# Patient Record
Sex: Female | Born: 1985 | Race: White | Hispanic: No | Smoking: Current every day smoker
Health system: Southern US, Community
[De-identification: ages and names within clinical notes are randomized; demographics above are authoritative.]

## PROBLEM LIST (undated history)

## (undated) DIAGNOSIS — F909 Attention-deficit hyperactivity disorder, unspecified type: Secondary | ICD-10-CM

## (undated) DIAGNOSIS — F32A Depression, unspecified: Secondary | ICD-10-CM

## (undated) DIAGNOSIS — N83209 Unspecified ovarian cyst, unspecified side: Secondary | ICD-10-CM

## (undated) DIAGNOSIS — F329 Major depressive disorder, single episode, unspecified: Secondary | ICD-10-CM

## (undated) DIAGNOSIS — F419 Anxiety disorder, unspecified: Secondary | ICD-10-CM

## (undated) HISTORY — PX: CYSTECTOMY: SUR359

## (undated) HISTORY — DX: Anxiety disorder, unspecified: F41.9

## (undated) HISTORY — PX: WISDOM TOOTH EXTRACTION: SHX21

## (undated) HISTORY — DX: Unspecified ovarian cyst, unspecified side: N83.209

## (undated) HISTORY — DX: Attention-deficit hyperactivity disorder, unspecified type: F90.9

## (undated) HISTORY — DX: Major depressive disorder, single episode, unspecified: F32.9

## (undated) HISTORY — DX: Depression, unspecified: F32.A

---
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
## 2004-08-11 ENCOUNTER — Inpatient Hospital Stay (HOSPITAL_COMMUNITY): Admission: AD | Admit: 2004-08-11 | Discharge: 2004-08-11 | Payer: Self-pay | Admitting: *Deleted

## 2004-08-13 ENCOUNTER — Inpatient Hospital Stay (HOSPITAL_COMMUNITY): Admission: AD | Admit: 2004-08-13 | Discharge: 2004-08-13 | Payer: Self-pay | Admitting: Obstetrics & Gynecology

## 2004-08-16 ENCOUNTER — Inpatient Hospital Stay (HOSPITAL_COMMUNITY): Admission: AD | Admit: 2004-08-16 | Discharge: 2004-08-16 | Payer: Self-pay | Admitting: Family Medicine

## 2004-08-17 ENCOUNTER — Ambulatory Visit: Payer: Self-pay | Admitting: Obstetrics and Gynecology

## 2004-08-21 ENCOUNTER — Inpatient Hospital Stay (HOSPITAL_COMMUNITY): Admission: AD | Admit: 2004-08-21 | Discharge: 2004-08-21 | Payer: Self-pay | Admitting: Obstetrics & Gynecology

## 2004-08-21 ENCOUNTER — Ambulatory Visit: Payer: Self-pay | Admitting: Obstetrics & Gynecology

## 2004-10-23 ENCOUNTER — Inpatient Hospital Stay (HOSPITAL_COMMUNITY): Admission: AD | Admit: 2004-10-23 | Discharge: 2004-10-23 | Payer: Self-pay | Admitting: Obstetrics & Gynecology

## 2004-11-03 ENCOUNTER — Ambulatory Visit (HOSPITAL_COMMUNITY): Admission: RE | Admit: 2004-11-03 | Discharge: 2004-11-03 | Payer: Self-pay | Admitting: Obstetrics & Gynecology

## 2004-11-09 ENCOUNTER — Inpatient Hospital Stay (HOSPITAL_COMMUNITY): Admission: AD | Admit: 2004-11-09 | Discharge: 2004-11-10 | Payer: Self-pay | Admitting: Obstetrics and Gynecology

## 2004-11-27 ENCOUNTER — Ambulatory Visit (HOSPITAL_COMMUNITY): Admission: RE | Admit: 2004-11-27 | Discharge: 2004-11-27 | Payer: Self-pay | Admitting: Obstetrics and Gynecology

## 2005-05-06 ENCOUNTER — Inpatient Hospital Stay (HOSPITAL_COMMUNITY): Admission: AD | Admit: 2005-05-06 | Discharge: 2005-05-06 | Payer: Self-pay | Admitting: *Deleted

## 2005-06-22 ENCOUNTER — Inpatient Hospital Stay (HOSPITAL_COMMUNITY): Admission: AD | Admit: 2005-06-22 | Discharge: 2005-06-22 | Payer: Self-pay | Admitting: Obstetrics and Gynecology

## 2005-06-24 ENCOUNTER — Inpatient Hospital Stay (HOSPITAL_COMMUNITY): Admission: AD | Admit: 2005-06-24 | Discharge: 2005-06-27 | Payer: Self-pay | Admitting: Obstetrics and Gynecology

## 2005-08-10 ENCOUNTER — Other Ambulatory Visit: Admission: RE | Admit: 2005-08-10 | Discharge: 2005-08-10 | Payer: Self-pay | Admitting: Obstetrics and Gynecology

## 2006-07-08 ENCOUNTER — Encounter (INDEPENDENT_AMBULATORY_CARE_PROVIDER_SITE_OTHER): Payer: Self-pay | Admitting: Specialist

## 2006-07-08 ENCOUNTER — Ambulatory Visit (HOSPITAL_COMMUNITY): Admission: RE | Admit: 2006-07-08 | Discharge: 2006-07-08 | Payer: Self-pay | Admitting: Obstetrics and Gynecology

## 2006-09-17 ENCOUNTER — Emergency Department (HOSPITAL_COMMUNITY): Admission: EM | Admit: 2006-09-17 | Discharge: 2006-09-17 | Payer: Self-pay | Admitting: Family Medicine

## 2006-11-12 ENCOUNTER — Emergency Department (HOSPITAL_COMMUNITY): Admission: EM | Admit: 2006-11-12 | Discharge: 2006-11-12 | Payer: Self-pay | Admitting: Emergency Medicine

## 2007-01-23 ENCOUNTER — Emergency Department (HOSPITAL_COMMUNITY): Admission: EM | Admit: 2007-01-23 | Discharge: 2007-01-23 | Payer: Self-pay | Admitting: Emergency Medicine

## 2007-01-26 ENCOUNTER — Inpatient Hospital Stay (HOSPITAL_COMMUNITY): Admission: AD | Admit: 2007-01-26 | Discharge: 2007-01-26 | Payer: Self-pay | Admitting: Obstetrics and Gynecology

## 2007-01-26 LAB — CONVERTED CEMR LAB
Bilirubin Urine: NEGATIVE
Clue Cells Wet Prep HPF POC: NONE SEEN
Hemoglobin, Urine: NEGATIVE
Hemoglobin: 13.1 g/dL
Ketones, ur: NEGATIVE mg/dL
MCV: 88.1 fL
Nitrite: NEGATIVE
Platelets: 294 10*3/uL
RBC: 4.25 M/uL
Urobilinogen, UA: 0.2
WBC, Wet Prep HPF POC: NONE SEEN
WBC: 5.8 10*3/uL
Yeast Wet Prep HPF POC: NONE SEEN

## 2007-01-29 ENCOUNTER — Ambulatory Visit: Payer: Self-pay | Admitting: Nurse Practitioner

## 2007-01-29 DIAGNOSIS — J45909 Unspecified asthma, uncomplicated: Secondary | ICD-10-CM | POA: Insufficient documentation

## 2007-01-29 DIAGNOSIS — F17200 Nicotine dependence, unspecified, uncomplicated: Secondary | ICD-10-CM | POA: Insufficient documentation

## 2007-01-29 DIAGNOSIS — N83209 Unspecified ovarian cyst, unspecified side: Secondary | ICD-10-CM

## 2007-04-15 ENCOUNTER — Ambulatory Visit: Payer: Self-pay | Admitting: Nurse Practitioner

## 2007-04-15 DIAGNOSIS — K589 Irritable bowel syndrome without diarrhea: Secondary | ICD-10-CM

## 2007-04-15 DIAGNOSIS — F411 Generalized anxiety disorder: Secondary | ICD-10-CM | POA: Insufficient documentation

## 2007-04-15 LAB — CONVERTED CEMR LAB
AST: 13 units/L (ref 0–37)
Albumin: 4.6 g/dL (ref 3.5–5.2)
Alkaline Phosphatase: 42 units/L (ref 39–117)
BUN: 12 mg/dL (ref 6–23)
Calcium: 9.4 mg/dL (ref 8.4–10.5)
Chloride: 105 meq/L (ref 96–112)
Creatinine, Ser: 0.71 mg/dL (ref 0.40–1.20)
Glucose, Bld: 82 mg/dL (ref 70–99)
MCV: 88.1 fL (ref 78.0–100.0)
Monocytes Relative: 6 % (ref 3–12)
Platelets: 243 10*3/uL (ref 150–400)
Potassium: 4.3 meq/L (ref 3.5–5.3)
RDW: 12.5 % (ref 11.5–15.5)
Total Protein: 7.6 g/dL (ref 6.0–8.3)

## 2007-04-16 ENCOUNTER — Encounter (INDEPENDENT_AMBULATORY_CARE_PROVIDER_SITE_OTHER): Payer: Self-pay | Admitting: Nurse Practitioner

## 2007-05-08 ENCOUNTER — Encounter (INDEPENDENT_AMBULATORY_CARE_PROVIDER_SITE_OTHER): Payer: Self-pay | Admitting: Nurse Practitioner

## 2007-09-27 ENCOUNTER — Inpatient Hospital Stay (HOSPITAL_COMMUNITY): Admission: AD | Admit: 2007-09-27 | Discharge: 2007-09-27 | Payer: Self-pay | Admitting: Obstetrics and Gynecology

## 2007-12-28 ENCOUNTER — Inpatient Hospital Stay (HOSPITAL_COMMUNITY): Admission: AD | Admit: 2007-12-28 | Discharge: 2007-12-28 | Payer: Self-pay | Admitting: Obstetrics and Gynecology

## 2008-01-05 ENCOUNTER — Inpatient Hospital Stay (HOSPITAL_COMMUNITY): Admission: AD | Admit: 2008-01-05 | Discharge: 2008-01-05 | Payer: Self-pay | Admitting: Obstetrics and Gynecology

## 2008-01-28 ENCOUNTER — Observation Stay (HOSPITAL_COMMUNITY): Admission: RE | Admit: 2008-01-28 | Discharge: 2008-01-28 | Payer: Self-pay | Admitting: Obstetrics and Gynecology

## 2008-01-31 ENCOUNTER — Inpatient Hospital Stay (HOSPITAL_COMMUNITY): Admission: AD | Admit: 2008-01-31 | Discharge: 2008-02-03 | Payer: Self-pay | Admitting: Obstetrics and Gynecology

## 2008-02-09 ENCOUNTER — Encounter: Admission: RE | Admit: 2008-02-09 | Discharge: 2008-02-09 | Payer: Self-pay | Admitting: Obstetrics and Gynecology

## 2010-03-01 ENCOUNTER — Ambulatory Visit (HOSPITAL_COMMUNITY)
Admission: RE | Admit: 2010-03-01 | Discharge: 2010-03-01 | Payer: Self-pay | Source: Home / Self Care | Attending: Surgery | Admitting: Surgery

## 2010-06-17 ENCOUNTER — Inpatient Hospital Stay (INDEPENDENT_AMBULATORY_CARE_PROVIDER_SITE_OTHER)
Admission: RE | Admit: 2010-06-17 | Discharge: 2010-06-17 | Disposition: A | Discharge status: Home / Self Care | Payer: Federal, State, Local not specified - PPO | Source: Ambulatory Visit | Attending: Emergency Medicine | Admitting: Emergency Medicine

## 2010-06-17 DIAGNOSIS — M545 Low back pain: Secondary | ICD-10-CM

## 2010-06-17 LAB — POCT URINALYSIS DIP (DEVICE)
Glucose, UA: NEGATIVE mg/dL
Hgb urine dipstick: NEGATIVE
Nitrite: NEGATIVE
Specific Gravity, Urine: 1.01 (ref 1.005–1.030)

## 2010-06-17 LAB — POCT PREGNANCY, URINE: Preg Test, Ur: NEGATIVE

## 2010-07-28 ENCOUNTER — Other Ambulatory Visit: Payer: Self-pay | Admitting: Physician Assistant

## 2010-07-28 ENCOUNTER — Encounter (INDEPENDENT_AMBULATORY_CARE_PROVIDER_SITE_OTHER): Payer: Self-pay | Admitting: Physician Assistant

## 2010-07-28 DIAGNOSIS — N912 Amenorrhea, unspecified: Secondary | ICD-10-CM

## 2010-07-28 LAB — POCT PREGNANCY, URINE: Preg Test, Ur: NEGATIVE

## 2010-07-29 NOTE — Group Therapy Note (Signed)
NAMESAVAHNA, CASADOS                 ACCOUNT NO.:  [id number]  MEDICAL RECORD NO.:  [id number]           PATIENT TYPE:  A  LOCATION:  [facility]                   FACILITY:  [facility]  PHYSICIAN:  Maylon Cos, CNM    DATE OF BIRTH:  Nov 15, 1985  DATE OF SERVICE:  07/28/2010                                 CLINIC NOTE  The patient presents to the GYN Clinic at [facility].  Reason for today's visit is no period since December since Depo-Provera injection, weight gain, desires Pap.  HISTORY OF PRESENT ILLNESS:  The patient is a 25 year old gravida 3, para 2-0-1-2 who presents as a self-referral to [place] secondary to receiving Depo-Provera for birth control in December.  She states she has not had a period since December.  Since then she has gained approximately 30 pounds.  She did not receive any Depo-Provera injections since then and has not been on any birth control.  She has taken multiple home pregnancy test and they have all been negative.  She has had breast tenderness.  She is concerned for pregnancy secondary to weight gain and increase in breast size and breast tenderness and also her amenorrheic state.  She also desires a Pap smear, she has not had one in approximately 2 years.  MEDICAL HISTORY:  She has no known drug allergies.  Her current medications are Vicodin, Wellbutrin.  HEALTH CARE MAINTENANCE:  She is up to date on her flu shot and her tetanus shot.  Her last mammogram was in December 2011 and it was normal.  Her last dental exam was in December 2011.  MENSTRUAL HISTORY:  Her last period as noted previously was February 24, 2010.  Previously, she had regular periods until receiving Depo-Provera shot.  CONTRACEPTIVE HISTORY:  She currently has no form of contraceptives. Her last Depo-Provera was in December 2011.  GYNECOLOGIC HISTORY:  She has no history of abnormal Pap smears.  She has previously been told she has ovarian cyst, but they have not had to be  surgically removed.  PERSONAL MEDICAL HISTORY:  She has chronic back pain, but no history of back surgery.  SURGICAL HISTORY:  She had a benign lump removed under her right arm in December 2011.  She did have ovarian cyst removed in April 2010.  SOCIAL HISTORY:  She is not currently employed.  She does smoke approximately one pack a day for approximately 5 years.  She drinks alcohol socially on the weekends.  She is married and has one sexual partner in the last year.  FAMILY HISTORY:  Her mother had high blood pressure.  Systemic review is negative other than what has already been reviewed in the HPI.  PHYSICAL EXAMINATION:  GENERAL:  Mariadelaluz is a pleasant 25 year old Caucasian female who appears to be her stated age. HEENT:  Grossly normal. VITAL SIGNS:  Stable and noted in her chart. NECK:  There is no thyromegaly. HEART:  Regular rate and rhythm without murmur or bruits. LUNGS:  Clear to auscultation bilaterally A and P. BREASTS:  Symmetrical without dimpling or retracting of the skin. Nipples are erect without discharge. ABDOMEN:  Soft and nontender.  No hepatosplenomegaly. GENITALIA:  She has a shaved perineum.  External genitalia is grossly normal without lesions.  She has a parous cervix that is smooth without lesions.  There is scant amount of creamy discharge noted in the vault that is not odorous.  Bimanual exam reveals no cervical motion tenderness, nonenlarged, nontender uterus with nonenlarged, nontender adnexa. RECTAL:  Externally is within normal limits.  Internal exam is deferred. EXTREMITIES:  Lower extremities have equal pulses bilaterally.  No edema.  Equal strength. NEUROLOGIC:  Cranial nerves are grossly intact. SKIN:  Very dry.  Urine pregnancy test is negative.  ASSESSMENT:  Amenorrhea.  PLAN: 1. We have discussed today multiple options for birth control and plan     for amenorrheic state.  We will proceed today, given a prescription     for  Provera and to attend a Provera Challenge given that her urine     pregnancy test is negative.  She is given 10 mg for 10 days to hope     to have a withdrawal bleed.  Also assessed TSH today. 2. Nutrition exercise counseling in regards to the patient's desire     for weight loss. 3. Smoking cessation strongly encouraged. 4. The patient should follow up in 3 weeks to assess effectiveness of     the Provera Challenge and also discuss birth control options.  The     patient is possibly interested in oral contraceptive pills or     Mirena.  We have discussed given her one pack a day habit and     combined OCPs are another option.  Verbalized understanding.  The     patient should follow up in 3 weeks or p.r.n. problems.          ______________________________ Maylon Cos, CNM    SS/MEDQ  D:  07/28/2010  T:  07/28/2010  Job:  [id number]
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
## 2010-08-01 NOTE — Consult Note (Signed)
[name], [name]                 ACCOUNT NO.:  [id number]   MEDICAL RECORD NO.:  [id number]          PATIENT TYPE:  MAT   LOCATION:  [facility]                          [facility]ILITY:  [facility]   PHYSICIAN:  Janine Limbo, M.D.DATE OF BIRTH:  [birth year]   DATE OF CONSULTATION:  12/28/2007  DATE OF DISCHARGE:  12/28/2007                                 CONSULTATION   HISTORY OF PRESENT ILLNESS:  [name] is a 25 year old female, gravida 2,  para 1-0-0-1, who presents at 35 weeks' gestation.  Her EDC is January 31, 2008.  The patient is followed at the [facility]  and Gynecology Division of [facility].  The patient  presents complaining of irregular uterine contractions.  She denies  rupture of membranes and vaginal bleeding.  The pregnancy has been  complicated by prior bouts of uterine contractions.  She has also had  pelvic pain during this pregnancy.   PAST MEDICAL HISTORY:  The patient has a history of an ovarian cyst and  in 2008, she had laparoscopy with an ovarian cystectomy.  The patient  also has a past history of depression.   SOCIAL HISTORY:  The patient has a past history of cigarette smoking.  She denies alcohol use and other recreational drug uses.   REVIEW OF SYSTEMS:  Noncontributory.   FAMILY HISTORY:  Noncontributory.   DRUG ALLERGIES:  PHENYLPROPANOLAMINE.   PHYSICAL EXAMINATION:  VITAL SIGNS:  Temperature is 98.2, pulse 98,  respirations 18, and blood pressure is 112/63.  HEENT:  Within normal limits except for the patient that looks  uncomfortable when she has a contraction.  CHEST:  Clear.  HEART:  Regular rate and rhythm.  ABDOMEN:  Gravid and nontender.  EXTREMITIES:  Grossly normal.  NEUROLOGIC:  Grossly normal.  PELVIC:  Cervix is closed, 25% effaced, and -2 to -3 in station.   LABORATORY VALUES:  Blood type is A positive, antibody screen negative,  VDRL nonreactive, rubella immune, HBsAg negative, HIV negative, GC  negative, and Chlamydia negative.  Urinalysis on evaluation today is  negative.  Wet prep today is negative.   The patient was monitored and was found to have uterine irritability  with an occasional contraction.  The nonstress test is reactive.   ASSESSMENT:  1. A 35-week gestation.  2. Uterine irritability with occasional contractions.   PLAN:  We will give the patient medication in maternity admissions to  decrease the frequency of her uterine contractions and her uterine  irritability.  The patient has Vicodin at home that she can use for  pain.  She will be discharged to home to followup in the office.  She  was told to call for questions or concerns.      Janine Limbo, M.D.  Electronically Signed     AVS/MEDQ  D:  12/28/2007  T:  12/28/2007  Job:  [id number]
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
## 2010-08-04 NOTE — H&P (Signed)
[name], KUEHNE          ACCOUNT NO.:  [id number]   MEDICAL RECORD NO.:  [id number]          PATIENT TYPE:  AMB   LOCATION:  SDC                           [facility]ILITY:  [facility]   PHYSICIAN:  Dois Davenport A. Rivard, M.D. DATE OF BIRTH:  [birth year]   DATE OF ADMISSION:  DATE OF DISCHARGE:                              HISTORY & PHYSICAL   REASON FOR ADMISSION:  Right ovarian cyst.   HISTORY OF PRESENT ILLNESS:  This is a 25 year old, single, white  female, gravida 1, para 1, abortus 0 who presented to our office on  June 27, 2006 complaining of right lower quadrant pelvic pain radiating  to her back on and off with a maximum intensity of 6/10, with minimal  relief with Toradol. The patient is exacerbated with certain position  and certain movement. She also reports for the last two months deep  dyspareunia and since December of 2007 cycles that have been become  longer every 45 days, lasting only 3 to 4 days with a decrease in  menstrual flow. Her last menstrual period was July 03, 2006 and started  at 35 days from her previous cycle.   An ultrasound performed in our office on July 01, 2006 revealed a right  ovarian simple cyst measuring 7.3 x 5.2 x 4.8 cm with a small amount of  free fluid in the pelvis. Left ovary and uterus are within normal  limits.   She is admitted today to undergo laparoscopy with ovarian cystectomy.   REVIEW OF SYSTEMS:  HEENT:  Negative. CARDIOVASCULAR:  Negative.  GASTROINTESTINAL:  Some nausea and diarrhea with Toradol. GENITOURINARY:  Negative. NEUROLOGICAL:  Negative. PSYCHIATRIC:  Negative.   PAST MEDICAL HISTORY:  1. History of depression.  2. Status post laparoscopy at age 35.  3. Spontaneous vaginal delivery in April of 2007 of a daughter named      [name].   ALLERGIES:  PHENYLPROPANOLAMINE.   SOCIAL HISTORY:  Smoker. 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   FAMILY HISTORY:  Maternal grandmother with breast  cancer. Paternal  grandmother is deceased of ovarian cancer in her late 19s.   PHYSICAL EXAMINATION:  Current weight is 166 pounds for a height of 5  foot 3 inches. Blood pressure 110/80.  HEENT:  Normal. Thyroid not enlarged.  HEART:  Regular rate and rhythm.  CHEST:  Clear.  BACK:  No CVA tenderness.  ABDOMEN:  No tenderness, masses or hepatosplenomegaly.  EXTREMITIES:  Negative.  NEUROLOGICAL:  Within normal limits.  GYNECOLOGICAL EXAM:  Reveals normal external genitalia, vagina and  cervix. Uterus is normal in size and shape. Right adnexa is slightly  enlarged, about 5 cm mobile, and slightly tender. Left adnexa is normal.   ASSESSMENT:  Right ovarian cyst with pelvic pain in a patient desiring  operative treatment.   The patient will undergo right ovarian cystectomy via laparoscopy. The  procedure has been thoroughly reviewed with the patient as well as  possible complications including but not limited to bleeding; infection,  injury to bowels, bladder or ureters; need for laparotomy; need for  oophorectomy; hospital  stay. Full recovery has been discussed as well,  and the patient is agreeable with plan.      Crist Fat Rivard, M.D.  Electronically Signed     SAR/MEDQ  D:  07/07/2006  T:  07/07/2006  Job:  [id number]

## 2010-08-04 NOTE — Op Note (Signed)
[facility]  Patient:    [name], [name]                 MRN: [id number] Proc. Date: 10/23/99 Adm. Date:  [id number] Attending:  Pleas Koch CC:         Doreatha Lew, M.D. [facility] [address] [place]  [place] [address]   Operative Report  PREOPERATIVE DIAGNOSIS:  Chronic pelvic pain.  POSTOPERATIVE DIAGNOSIS:  Chronic pelvic pain.  OPERATION:  Diagnostic laparoscopy.  SURGEON:  Georgina Peer, M.D.  ANESTHESIA:  General.  ESTIMATED BLOOD LOSS:  Less than 20 cc.  COMPLICATIONS:  None.  FINDINGS:  Normal pelvis, normal abdomen, normal appendix.  INDICATIONS:  The patient is a 25 year old nulligravida with an 18 month history of chronic right lower quadrant and pelvic pain.  The patient had a history of ovarian cyst but did not currently have a cyst.  She had been on oral contraceptives and oral pain medicine which did not relieve the pain. She was aware of risks and complications of laparoscopy including bowel, bladder, or vascular injury, bleeding, infection, possibility that no cause could be found for the pain during the laparoscopy and she consented to this as well as did her mother and she was ready to proceed.  DESCRIPTION OF PROCEDURE:  The patient was taken to the operating room and placed in dorsal lithotomy position and after being given general anesthetic, the perineum, vagina, abdomen were prepped and draped sterilely.  The bladder emptied with the catheter.  Bimanual examination revealed normal sized ovaries, normal uterus.  A vertical subumbilical incision was made after a uterine manipulator was placed vaginally.  2.5L of carbon dioxide gas insufflated to create a pneumoperitoneum.  Laparoscopic trocar and sleeve were placed and then under direct vision, a superpubic 5 mm probe was placed.  The following findings were noted.  There appeared to be no injuries from laparoscopic trocar placements.   The appendix appeared normal.  The upper abdomen appeared normal.  The surface of the bowel appeared normal.  The uterus appeared normal without mottling, abnormality, evidence of fibroids. The peritoneal surfaces over the bladder and anterior abdominal wall appeared normal without defect.  The right ovary appeared normal, was freely mobile as was the tube was also normal and freely mobile.  There was no evidence of adhesions, no evidence of endometriosis.  The ovarian fossa was normal and the ureter was clearly visualized, there was no evidence of ovarian fossa implants or retraction pockets.  The cul-de-sac was also without pathology.  The left ovary similarly was normal size, was freely mobile, the ovarian fossa was normal and the tube was normal.  With these findings being photodocumented, the procedure was terminated.  The ports removed.  Air allowed to escape from the abdomen.  A total of 10 cc of 0.25% Marcaine was injected into the ports and the ports were closed with subcuticular Dexon.  Sponge, needle and instrument counts were correct.  The patient was transferred to recovery area in good condition. DD:  10/23/99 TD:  10/23/99 Job: [id number]

## 2010-08-04 NOTE — Op Note (Signed)
[name], GABA          ACCOUNT NO.:  [id number]   MEDICAL RECORD NO.:  [id number]          PATIENT TYPE:  AMB   LOCATION:  SDC                           [facility]   PHYSICIAN:  Dois Davenport A. Rivard, M.D. DATE OF BIRTH:  Apr 12, 1985   DATE OF PROCEDURE:  07/08/2006  DATE OF DISCHARGE:                               OPERATIVE REPORT   PREOPERATIVE DIAGNOSIS:  Right ovarian cyst.   POSTOPERATIVE DIAGNOSIS:  Bilateral ovarian cysts.   ANESTHESIA:  General, Dr. Arby Barrette.   PROCEDURE:  Bilateral ovarian cystectomies via laparoscopy with pelvic  washings.   SURGEON:  Crist Fat. Rivard, M.D.   ASSISTANTMarquis Lunch. Adline Peals.   PROCEDURE NOTE:  After being informed of the planned procedure with  possible complications including bleeding; infection; injury to bowels,  bladder, or ureters; need for laparotomy; need for oophorectomy,  informed consent is obtained. The patient is taken to OR #4, given  general anesthesia with endotracheal intubation. She is placed in a  lithotomy position, prepped and draped in a sterile fashion, and a Foley  catheter is inserted in her bladder. A pelvic exam reveals an anteverted  uterus, normal in size, with a bulging mass in the anterior cul-de-sac  which appears to be mobile and smooth. A weighted speculum is inserted.  Anterior lip of the cervix is grasped with a tenaculum forceps, and an  acorn manipulator is placed. Speculum is removed.   We infiltrate the umbilical area with 6 mL of Marcaine 0.25 and perform  a 10-mm vertical incision over looking the previous incision. This is  brought down bluntly to the fascia which was identified and grasped with  Kocher forceps. The fascia is then incised with Mayo scissors and  peritoneum is entered bluntly. A 0 Vicryl pursestring suture is placed  in the fascia, and a 10-mm Hasson trocar is inserted easily, held in  place with the previously placed pursestring suture. This allows [place] to  insufflate a pneumoperitoneum with CO2 at a maximum pressure of 15 mmHg.   A laparoscope is inserted. Observations:  We see a large ovarian cyst  measuring 7 to 8 cm arising from the left ovary with a smooth exterior  wall. No nodularity. Comb-like blood vessels and elongated utero-ovarian  ligament, all characteristic of a benign tumor. The left tube is normal.  After mobilizing with the uterus, we are able to see the posterior cul-  de-sac which is normal. We visualized the right tube which is normal,  but the right ovary is difficult to mobilize, and we need to insert a  second trocar to finalize our observation. A 10-mm suprapubic trocar is  inserted under direct visualization after infiltration with Marcaine  0.25, 4 ml. A left lower quadrant 5-mm trocar is also inserted under  direct visualization after infiltrating with 5 mL of Marcaine 0.25, and  with these trocars in place, we are able to mobilize the right ovary  which is also a carrier of a 5-cm cyst, smooth wall, comb-like blood  vessels and elongated utero-ovarian ligament. The tubo-ovarian  relationship is normal on both sides, and the posterior cul-de-sac  was  free of disease. We are also able to visualize the appendix which was  normal, liver and gallbladder normal. Peritoneal surfaces appear smooth  everywhere, and there is a little bit of pelvic wall adhesions with the  left sigmoid colon.   Decision is made to place a right lower quadrant 5-mm trocar after  infiltration with 4 mL of Marcaine 0.25, and we start her procedure with  the left ovary. Using a Monopolar scissor, we are able to cauterize a  portion of the exterior capsule of the ovary and incise this capsule.  Please note that prior to proceeding, pelvic washings are completed. The  cyst is then ruptured. Clear fluid is evacuated, and we are able to  identify the cyst wall away from the ovarian wall and peel this cyst  completely from the ovary very easily. We  then irrigate profusely with  warm saline and systemically cauterize all bleeding points inside the  ovary until hemostasis is deemed adequate. We proceed in the same  fashion on the right side using monopolar scissors. We trace a line of  opening. Cyst is evacuated with clear fluid. The cyst capsule is  identified, grasped and peeled completely away from the ovary. Again,  the ovary is then irrigated profusely with warm saline, and we proceed  with systematic bipolar cauterization of the inside of the ovary until  hemostasis is deemed adequate. We then profusely irrigate the pelvic and  let both ovaries float freely in warm saline and again contemplate a  satisfactory hemostasis. All trocars are then removed after evacuation  of the pneumoperitoneum. The suprapubic incision fascia is closed with a  figure-of-eight stitch of 0 Vicryl. The umbilical incision fashion is  closed with a previously placed pursestring suture of 0 Vicryl, and all  4 incision skin is closed with subcuticular suture of 4-0 Vicryl and  steri-stripped.   Instrument and sponge and count is complete x2. Estimated blood loss is  100 mL. The procedure is very well tolerated by the patient who is taken  to the recovery room in a well and stable condition and will be  discharged home tonight.      Crist Fat Rivard, M.D.  Electronically Signed     SAR/MEDQ  D:  07/08/2006  T:  07/09/2006  Job:  [id number]
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
## 2010-08-04 NOTE — Group Therapy Note (Signed)
[name], [name] NO.:  [id number]   MEDICAL RECORD NO.:  [id number]          PATIENT TYPE:  WOC   LOCATION:  [facility]                   FACILITY:  [facility]   PHYSICIAN:  Argentina Donovan, MD        DATE OF BIRTH:  October 12, 1985   DATE OF SERVICE:                                    CLINIC NOTE   HISTORY OF PRESENT ILLNESS:  The patient is a 25 year old nulligravida white  female with a last menstrual period of Jul 26, 2004, and has regular  periods.  She was seen most recently on the night prior to her visit here, 3  days prior to that and a few days prior to that for admissions for abrupt  onset of low abdominal pain which was diagnosed as a hemorrhagic left  ovarian cyst.  Interestingly enough, the diameter of the cyst, which is  extremely tense, was 6.5 cm on Aug 16, 2004, and had measured just 4.7 cm on  Aug 11, 2004.   PHYSICAL EXAMINATION:  ABDOMEN:  Soft, flat, tender in the left lower  quadrant, but without guarding or rebound.  PELVIC:  External genitalia is normal.  BUS within normal limits.  Vagina is  clean and well rugated.  The cervix is nulliparous and clean.  Pap smear was  taken.  The uterus was of normal size, shape and consistency.  The right  adnexa was normal.  The left adnexa was easily palpable with a cyst that was  extraordinarily tense and tender.   MEDICAL DECISION MAKING:  Venous flow was seen on the Doppler so torsion  does not seem to be a problem, and the patient does not look like a patient  with a torted cyst.  We have discussed in detail the etiology of hemorrhagic  ovarian cysts.  She had one back with a laparoscopy and examination by Dr.  Elliot Gault in 2000, and apparently has had subsequent cysts on occasion, and has  not been on oral contraceptives for 2 years.  I would suggest that she goes  back on that to prevent, to some degree, the frequency that these cysts  come.  We are going to treat her conservatively, but we have warned  her  about the possibility of torsion or rupture, at which time the pain would  increase over what it is now, and she would return to the MAU.  We are going  to put her on Dilaudid for pain as Percocet is not successful in controlling  the pain.  She has a non-stressful, non-physical job so she probably will be  able to function still at her work in hopes that this resolves quickly.  We  gave her the choice of that versus laparoscopic drainage, and we have opted  to go with the more conservative treatment in this nulligravida patient.   IMPRESSION:  Hemorrhagic, symptomatic left ovarian cyst.   PLAN:  1.  Birth-control suppression.  2.  Expectant observation.      PR/MEDQ  D:  08/17/2004  T:  08/17/2004  Job:  [id number]
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
## 2010-08-21 ENCOUNTER — Other Ambulatory Visit: Payer: Self-pay | Admitting: Family Medicine

## 2010-08-21 ENCOUNTER — Ambulatory Visit (INDEPENDENT_AMBULATORY_CARE_PROVIDER_SITE_OTHER): Payer: Self-pay | Admitting: Family Medicine

## 2010-08-21 DIAGNOSIS — Z30430 Encounter for insertion of intrauterine contraceptive device: Secondary | ICD-10-CM

## 2010-08-21 LAB — POCT PREGNANCY, URINE: Preg Test, Ur: NEGATIVE

## 2010-08-22 NOTE — Group Therapy Note (Signed)
[name], [name]                 ACCOUNT NO.:  [id number]  MEDICAL RECORD NO.:  [id number]           PATIENT TYPE:  A  LOCATION:  [facility]                   FACILITY:  [facility]  PHYSICIAN:  Lucina Mellow, DO   DATE OF BIRTH:  10/31/1985  DATE OF SERVICE:  08/21/2010                                 CLINIC NOTE  The patient presents for a Mirena IUD.  HISTORY OF PRESENT ILLNESS:  The patient is a gravida 3, para 2-0-1-2, who had presented to the clinic on Jul 28, 2010, after having referral getting Depo-Provera in December and developing amenorrhea.  The patient still has not had a period.  She was provided a Provera challenge and assessed by thyroid stimulating hormone.  The patient states that she did not have a withdrawal bleed, although she has been cramping when she was started period after her Provera for 10 days.  Her TSH was normal. The patient states that she still would like to go forth with the Mirena IUD she is "petrified of getting  pregnant again."  The patient has no complaints today.  LABORATORY DATA:  Negative urine pregnancy test in the clinic today.  In May, she had a normal Pap test and negative gonorrhea and Chlamydia test at that time.  Informed consent was obtain and time-out was performed for a Mirena IUD.  The patient was placed in dorsal lithotomy position and a speculum was placed.  Her cervix was identified, noted to be parous.  A Fox swab was used to cleanse off normal-appearing cervical mucus.  Betadine swab was then used to cleanse the cervix.  Sterile sound was placed and the uterus was sounded to 8 cm without difficulty. This was matched up with the Mirena IUD to 8 cm.  The  Mirena IUD was placed in normal fashion without complication, the strings were trimmed approximately 2 cm in the cervical os.  The patient tolerated the procedure well.  ASSESSMENT:  Family planning, an intrauterine device was placed today without difficulty.  The patient  will return to clinic in 4-6 weeks to have her strings checked.  If she have a problem earlier she should call the clinic and we will see her and address her complaint.          ______________________________ Lucina Mellow, DO    SH/MEDQ  D:  08/21/2010  T:  08/22/2010  Job:  [id number]

## 2010-09-16 DIAGNOSIS — N910 Primary amenorrhea: Secondary | ICD-10-CM | POA: Insufficient documentation

## 2010-09-24 ENCOUNTER — Emergency Department (HOSPITAL_COMMUNITY)
Admission: EM | Admit: 2010-09-24 | Discharge: 2010-09-24 | Disposition: A | Discharge status: Home / Self Care | Payer: BC Managed Care – PPO | Attending: Emergency Medicine | Admitting: Emergency Medicine

## 2010-09-24 ENCOUNTER — Emergency Department (HOSPITAL_COMMUNITY): Payer: BC Managed Care – PPO

## 2010-09-24 DIAGNOSIS — S93409A Sprain of unspecified ligament of unspecified ankle, initial encounter: Secondary | ICD-10-CM | POA: Insufficient documentation

## 2010-09-24 DIAGNOSIS — S93609A Unspecified sprain of unspecified foot, initial encounter: Secondary | ICD-10-CM | POA: Insufficient documentation

## 2010-09-24 DIAGNOSIS — X500XXA Overexertion from strenuous movement or load, initial encounter: Secondary | ICD-10-CM | POA: Insufficient documentation

## 2010-10-02 ENCOUNTER — Ambulatory Visit: Payer: Medicaid Other | Admitting: Family Medicine

## 2010-12-14 LAB — URINALYSIS, ROUTINE W REFLEX MICROSCOPIC
Bilirubin Urine: NEGATIVE
Glucose, UA: NEGATIVE
Hgb urine dipstick: NEGATIVE
Ketones, ur: NEGATIVE
pH: 5.5

## 2010-12-14 LAB — URINE CULTURE
Colony Count: NO GROWTH
Culture: NO GROWTH

## 2010-12-18 LAB — WET PREP, GENITAL
Clue Cells Wet Prep HPF POC: NONE SEEN
Trich, Wet Prep: NONE SEEN

## 2010-12-18 LAB — URINALYSIS, ROUTINE W REFLEX MICROSCOPIC
Bilirubin Urine: NEGATIVE
Hgb urine dipstick: NEGATIVE
Protein, ur: NEGATIVE
Urobilinogen, UA: 0.2

## 2010-12-18 LAB — GC/CHLAMYDIA PROBE AMP, GENITAL
Chlamydia, DNA Probe: NEGATIVE
GC Probe Amp, Genital: NEGATIVE

## 2010-12-19 LAB — CBC
Hemoglobin: 10.3 — ABNORMAL LOW
MCHC: 33.3
Platelets: 209
Platelets: 243
RBC: 3.53 — ABNORMAL LOW
RDW: 13.2
RDW: 14
WBC: 9
WBC: 9.4

## 2010-12-19 LAB — RPR
RPR Ser Ql: NONREACTIVE
RPR Ser Ql: NONREACTIVE

## 2010-12-26 LAB — POCT PREGNANCY, URINE
Operator id: 117411
Preg Test, Ur: NEGATIVE

## 2010-12-26 LAB — URINALYSIS, ROUTINE W REFLEX MICROSCOPIC
Hgb urine dipstick: NEGATIVE
Specific Gravity, Urine: 1.025
Urobilinogen, UA: 0.2

## 2010-12-26 LAB — WET PREP, GENITAL: Yeast Wet Prep HPF POC: NONE SEEN

## 2010-12-26 LAB — CBC
Hemoglobin: 13.1
Platelets: 294
RDW: 13.1

## 2010-12-26 LAB — GC/CHLAMYDIA PROBE AMP, GENITAL: Chlamydia, DNA Probe: NEGATIVE

## 2011-01-02 LAB — POCT PREGNANCY, URINE: Operator id: 282151

## 2011-01-04 ENCOUNTER — Telehealth: Payer: Self-pay | Admitting: *Deleted

## 2011-01-04 MED ORDER — METRONIDAZOLE 500 MG PO TABS: 500.0000 mg | ORAL_TABLET | Freq: Two times a day (BID) | ORAL | Status: AC

## 2011-01-04 NOTE — Telephone Encounter (Signed)
Pt left message stating that she has Mirena IUD and is having sx of BV. She says she has had BV in the past and the sx are the same- would like Rx. She called for appt and was given 01/31/11 and does not want to wait that long.  I returned pt call and asked what sx she is having . She reports malodorous d/c that is light tan in color. She states it is different than a yeast infection. I told pt that I can send in Rx. She should keep appt on 01/31/11 because she did not have a follow up after Mirena was inserted. Pt voiced understanding.   Rx sent per standing protocaol

## 2011-01-31 ENCOUNTER — Ambulatory Visit: Payer: BC Managed Care – PPO | Admitting: Physician Assistant

## 2011-02-22 ENCOUNTER — Encounter: Payer: Self-pay | Admitting: Family Medicine

## 2011-02-22 ENCOUNTER — Ambulatory Visit (INDEPENDENT_AMBULATORY_CARE_PROVIDER_SITE_OTHER): Payer: BC Managed Care – PPO | Admitting: Family Medicine

## 2011-02-22 DIAGNOSIS — A499 Bacterial infection, unspecified: Secondary | ICD-10-CM

## 2011-02-22 DIAGNOSIS — N760 Acute vaginitis: Secondary | ICD-10-CM

## 2011-02-22 DIAGNOSIS — B9689 Other specified bacterial agents as the cause of diseases classified elsewhere: Secondary | ICD-10-CM | POA: Insufficient documentation

## 2011-02-22 DIAGNOSIS — Z975 Presence of (intrauterine) contraceptive device: Secondary | ICD-10-CM

## 2011-02-22 NOTE — Patient Instructions (Signed)
Bacterial Vaginosis Bacterial vaginosis (BV) is a vaginal infection where the normal balance of bacteria in the vagina is disrupted. The normal balance is then replaced by an overgrowth of certain bacteria. There are several different kinds of bacteria that can cause BV. BV is the most common vaginal infection in women of childbearing age. CAUSES   The cause of BV is not fully understood. BV develops when there is an increase or imbalance of harmful bacteria.   Some activities or behaviors can upset the normal balance of bacteria in the vagina and put women at increased risk including:   Having a new sex partner or multiple sex partners.   Douching.   It is not clear what role sexual activity plays in the development of BV. However, women that have never had sexual intercourse are rarely infected with BV.  Women do not get BV from toilet seats, bedding, swimming pools or from touching objects around them.  SYMPTOMS   Grey vaginal discharge.   A fish-like odor with discharge, especially after sexual intercourse.   Itching or burning of the vagina and vulva.   Burning or pain with urination.   Some women have no signs or symptoms at all.  DIAGNOSIS  Your caregiver must examine the vagina for signs of BV. Your caregiver will perform lab tests and look at the sample of vaginal fluid through a microscope. They will look for bacteria and abnormal cells (clue cells), a pH test higher than 4.5, and a positive amine test all associated with BV.  RISKS AND COMPLICATIONS   Pelvic inflammatory disease (PID).   Infections following gynecology surgery.   Developing HIV.   Developing herpes virus.  TREATMENT  Sometimes BV will clear up without treatment. However, all women with symptoms of BV should be treated to avoid complications, especially if gynecology surgery is planned. Female partners generally do not need to be treated. However, BV may spread between female sex partners so treatment is  helpful in preventing a recurrence of BV.   BV may be treated with antibiotics. The antibiotics come in either pill or vaginal cream forms. Either can be used with nonpregnant or pregnant women, but the recommended dosages differ. These antibiotics are not harmful to the baby.   BV can recur after treatment. If this happens, a second round of antibiotics will often be prescribed.   Treatment is important for pregnant women. If not treated, BV can cause a premature delivery, especially for a pregnant woman who had a premature birth in the past. All pregnant women who have symptoms of BV should be checked and treated.   For chronic reoccurrence of BV, treatment with a type of prescribed gel vaginally twice a week is helpful.  HOME CARE INSTRUCTIONS   Finish all medication as directed by your caregiver.   Do not have sex until treatment is completed.   Tell your sexual partner that you have a vaginal infection. They should see their caregiver and be treated if they have problems, such as a mild rash or itching.   Practice safe sex. Use condoms. Only have 1 sex partner.  PREVENTION  Basic prevention steps can help reduce the risk of upsetting the natural balance of bacteria in the vagina and developing BV:  Do not have sexual intercourse (be abstinent).   Do not douche.   Use all of the medicine prescribed for treatment of BV, even if the signs and symptoms go away.   Tell your sex partner if you have  BV. That way, they can be treated, if needed, to prevent reoccurrence.  SEEK MEDICAL CARE IF:   Your symptoms are not improving after 3 days of treatment.   You have increased discharge, pain, or fever.  MAKE SURE YOU:   Understand these instructions.   Will watch your condition.   Will get help right away if you are not doing well or get worse.  FOR MORE INFORMATION  Division of STD Prevention (DSTDP), Centers for Disease Control and Prevention: [employer] American Social  Health Association (ASHA): www.ashastd.org  Document Released: 03/05/2005 Document Revised: 11/15/2010 Document Reviewed: 08/26/2008 [facility] Patient Information 2012 [place], [place].

## 2011-02-22 NOTE — Progress Notes (Signed)
  Subjective:    Patient ID: [name], female    DOB: [birth year], 25 y.o.   MRN: [id number]  HPI Patient seen for concerns of bacterial vaginosis.  Had Mirena IUD placed May 2012.  Since that time has had vaginal discharge - thin malodous yellow-whitish.  Had prescription in October for flagyl, which improved the discharge.  No discharge since then.  Would like IUD strings checked.  Denies abdominal cramping, vaginal bleeding, vaginal discharge.     Review of Systems     Objective:   Physical Exam  Constitutional: She appears well-developed and well-nourished.  HENT:  Head: Normocephalic and atraumatic.  Abdominal: Soft. Bowel sounds are normal. She exhibits no distension and no mass. There is no tenderness. There is no rebound and no guarding.  Genitourinary:       Normal external female genitalia.  Cervix visualized.  Strings in place.  No vaginal discharge.          Assessment & Plan:  1.  Bacterial Vaginosis Patient asymptomatic now.  Discussed treatment including oral or vaginal probiotics, regulating pH, etc.  Patient to follow up if becomes symptomatic. 2.  IUD in place String well visualized.

## 2011-04-13 ENCOUNTER — Telehealth: Payer: Self-pay | Admitting: *Deleted

## 2011-04-13 MED ORDER — METRONIDAZOLE 250 MG PO TABS: 500.0000 mg | ORAL_TABLET | Freq: Two times a day (BID) | ORAL | Status: AC

## 2011-04-13 NOTE — Telephone Encounter (Signed)
Pt left message stating that she would like Rx for BV.  I returned pt's call and left message that I will send in Rx for Flagyl. If the medication is not effective, she should call us back and will need appt for evaluation.

## 2011-06-27 ENCOUNTER — Encounter (HOSPITAL_COMMUNITY): Payer: Self-pay | Admitting: *Deleted

## 2011-06-27 ENCOUNTER — Inpatient Hospital Stay (HOSPITAL_COMMUNITY)
Admission: AD | Admit: 2011-06-27 | Discharge: 2011-06-28 | Disposition: A | Discharge status: Home / Self Care | Payer: BC Managed Care – PPO | Source: Ambulatory Visit | Attending: Obstetrics and Gynecology | Admitting: Obstetrics and Gynecology

## 2011-06-27 DIAGNOSIS — R1031 Right lower quadrant pain: Secondary | ICD-10-CM | POA: Insufficient documentation

## 2011-06-27 DIAGNOSIS — N949 Unspecified condition associated with female genital organs and menstrual cycle: Secondary | ICD-10-CM

## 2011-06-27 DIAGNOSIS — Z30431 Encounter for routine checking of intrauterine contraceptive device: Secondary | ICD-10-CM | POA: Insufficient documentation

## 2011-06-27 DIAGNOSIS — N83209 Unspecified ovarian cyst, unspecified side: Secondary | ICD-10-CM

## 2011-06-27 DIAGNOSIS — R102 Pelvic and perineal pain: Secondary | ICD-10-CM

## 2011-06-27 LAB — POCT PREGNANCY, URINE: Preg Test, Ur: NEGATIVE

## 2011-06-27 NOTE — MAU Note (Signed)
Pt reports she has a history of ovarian cyst. Has an IUD, has been having pelvic pain x 1 week,worsening over last 2 day. Cannot feel the IUD, has not had a period since 05/16/2011

## 2011-06-28 ENCOUNTER — Inpatient Hospital Stay (HOSPITAL_COMMUNITY): Payer: BC Managed Care – PPO

## 2011-06-28 LAB — URINE MICROSCOPIC-ADD ON

## 2011-06-28 LAB — URINALYSIS, ROUTINE W REFLEX MICROSCOPIC
Leukocytes, UA: NEGATIVE
Nitrite: NEGATIVE
Specific Gravity, Urine: >1.03 — ABNORMAL HIGH (ref 1.005–1.030)
pH: 6 (ref 5.0–8.0)

## 2011-06-28 LAB — DIFFERENTIAL
Basophils Absolute: 0 10*3/uL (ref 0.0–0.1)
Basophils Relative: 1 % (ref 0–1)
Lymphocytes Relative: 43 % (ref 12–46)
Monocytes Absolute: 0.5 10*3/uL (ref 0.1–1.0)
Monocytes Relative: 6 % (ref 3–12)
Neutro Abs: 4 10*3/uL (ref 1.7–7.7)
Neutrophils Relative %: 48 % (ref 43–77)

## 2011-06-28 LAB — CBC
HCT: 39.3 % (ref 36.0–46.0)
Hemoglobin: 13.4 g/dL (ref 12.0–15.0)
WBC: 8.3 10*3/uL (ref 4.0–10.5)

## 2011-06-28 LAB — WET PREP, GENITAL

## 2011-06-28 MED ORDER — TRAMADOL HCL 50 MG PO TABS: 50.0000 mg | ORAL_TABLET | Freq: Four times a day (QID) | ORAL | Status: AC | PRN

## 2011-06-28 MED ORDER — HYDROCODONE-ACETAMINOPHEN 5-325 MG PO TABS: 1.0000 | ORAL_TABLET | Freq: Four times a day (QID) | ORAL | Status: AC | PRN

## 2011-06-28 NOTE — MAU Provider Note (Signed)
History     CSN: [id number]  Arrival date and time: 06/27/11 2244   First Provider Initiated Contact with Patient 06/27/11 2353      Chief Complaint  Patient presents with  . Abdominal Pain   HPI This is a 26 y.o. female who presents with a week history of pelvic pain, mostly RLQ. States cannot feel Mirena strings. States last Mirena was displaced into cervix. ALso c/o breast tenderness and face breaking out. States has had many ovarian cysts and is sure this is one. Had one removed several years ago (benign).  Denies fever, nausea, or other somatic symptoms.  OB History    Grav Para Term Preterm Abortions TAB SAB Ect Mult Living   3 2 2  1 1           Past Medical History  Diagnosis Date  . ADD (attention deficit disorder with hyperactivity)   . Anxiety   . Depression   . Ovarian cyst     Past Surgical History  Procedure Date  . Cystectomy     twice  . Wisdom tooth extraction     Family History  Problem Relation Age of Onset  . Multiple sclerosis Mother     History  Substance Use Topics  . Smoking status: Current Everyday Smoker -- 0.5 packs/day    Types: Cigarettes  . Smokeless tobacco: Never Used  . Alcohol Use: Yes    Allergies:  Allergies  Allergen Reactions  . Pork-Derived Products Anaphylaxis, Hives and Swelling    Prescriptions prior to admission  Medication Sig Dispense Refill  . amphetamine-dextroamphetamine (ADDERALL) 10 MG tablet Take 10 mg by mouth 2 (two) times daily.        Marland Kitchen buPROPion (WELLBUTRIN SR) 150 MG 12 hr tablet Take 150 mg by mouth 2 (two) times daily.      . clonazePAM (KLONOPIN) 1 MG tablet Take 1 mg by mouth 3 (three) times daily as needed. For anxiety      . HYDROcodone-acetaminophen (VICODIN) 5-500 MG per tablet Take 1 tablet by mouth every 6 (six) hours as needed. For pain      . ibuprofen (ADVIL,MOTRIN) 200 MG tablet Take 400 mg by mouth every 6 (six) hours as needed. For pain        ROS As listed above in  HPI  Physical Exam   Blood pressure 130/73, pulse 93, temperature 97.4 F (36.3 C), temperature source Oral, resp. rate 18, height 5' 4.5" (1.638 m), weight 155 lb (70.308 kg), last menstrual period 05/16/2011, SpO2 100.00%.  Physical Exam  Constitutional: She is oriented to person, place, and time. She appears well-developed and well-nourished. No distress.  HENT:  Head: Normocephalic.  Cardiovascular: Normal rate.   Respiratory: Effort normal.  GI: Soft. She exhibits no distension. There is tenderness (Mostly RLQ). There is no rebound and no guarding.  Genitourinary: Vagina normal and uterus normal. No vaginal discharge found.  Musculoskeletal: Normal range of motion.  Neurological: She is alert and oriented to person, place, and time.  Skin: Skin is warm and dry.  Psychiatric: She has a normal mood and affect.   IMPRESSION:  1. Uterus unremarkable in appearance; intrauterine device noted in  expected position.  2. Simple appearing 4.8 cm right adnexal cyst noted. No evidence  for ovarian torsion. This is almost certainly benign, and no  specific imaging follow up is recommended according to the Society  of Radiologists in Ultrasound 2010 Consensus Conference Statement  (D Lavonda Jumbo al. Management of Asymptomatic  Ovarian and Other  Adnexal Cysts Imaged at [place]: Society of Radiologists in Ultrasound  Consensus Conference Statement 2010. Radiology 256 (Sept 2010):  943-954.).  MAU Course  Procedures  Assessment and Plan  A:  Right ovarian cyst      IUD in correct position  P:  Discharge home      Rx Tramadol, but pt states it does not work, wants vicodin      Rx Vicodin, no refills.      Followup as needed  [facility] 06/28/2011, 12:08 AM

## 2011-06-28 NOTE — Discharge Instructions (Signed)
Ovarian Cyst The ovaries are small organs that are on each side of the uterus. The ovaries are the organs that produce the female hormones, estrogen and progesterone. An ovarian cyst is a sac filled with fluid that can vary in its size. It is normal for a small cyst to form in women who are in the childbearing age and who have menstrual periods. This type of cyst is called a follicle cyst that becomes an ovulation cyst (corpus luteum cyst) after it produces the women's egg. It later goes away on its own if the woman does not become pregnant. There are other kinds of ovarian cysts that may cause problems and may need to be treated. The most serious problem is a cyst with cancer. It should be noted that menopausal women who have an ovarian cyst are at a higher risk of it being a cancer cyst. They should be evaluated very quickly, thoroughly and followed closely. This is especially true in menopausal women because of the high rate of ovarian cancer in women in menopause. CAUSES AND TYPES OF OVARIAN CYSTS:  FUNCTIONAL CYST: The follicle/corpus luteum cyst is a functional cyst that occurs every month during ovulation with the menstrual cycle. They go away with the next menstrual cycle if the woman does not get pregnant. Usually, there are no symptoms with a functional cyst.   ENDOMETRIOMA CYST: This cyst develops from the lining of the uterus tissue. This cyst gets in or on the ovary. It grows every month from the bleeding during the menstrual period. It is also called a "chocolate cyst" because it becomes filled with blood that turns brown. This cyst can cause pain in the lower abdomen during intercourse and with your menstrual period.   CYSTADENOMA CYST: This cyst develops from the cells on the outside of the ovary. They usually are not cancerous. They can get very big and cause lower abdomen pain and pain with intercourse. This type of cyst can twist on itself, cut off its blood supply and cause severe pain.  It also can easily rupture and cause a lot of pain.   DERMOID CYST: This type of cyst is sometimes found in both ovaries. They are found to have different kinds of body tissue in the cyst. The tissue includes skin, teeth, hair, and/or cartilage. They usually do not have symptoms unless they get very big. Dermoid cysts are rarely cancerous.   POLYCYSTIC OVARY: This is a rare condition with hormone problems that produces many small cysts on both ovaries. The cysts are follicle-like cysts that never produce an egg and become a corpus luteum. It can cause an increase in body weight, infertility, acne, increase in body and facial hair and lack of menstrual periods or rare menstrual periods. Many women with this problem develop type 2 diabetes. The exact cause of this problem is unknown. A polycystic ovary is rarely cancerous.   THECA LUTEIN CYST: Occurs when too much hormone (human chorionic gonadotropin) is produced and over-stimulates the ovaries to produce an egg. They are frequently seen when doctors stimulate the ovaries for invitro-fertilization (test tube babies).   LUTEOMA CYST: This cyst is seen during pregnancy. Rarely it can cause an obstruction to the birth canal during labor and delivery. They usually go away after delivery.  SYMPTOMS   Pelvic pain or pressure.   Pain during sexual intercourse.   Increasing girth (swelling) of the abdomen.   Abnormal menstrual periods.   Increasing pain with menstrual periods.   You stop having   menstrual periods and you are not pregnant.  DIAGNOSIS  The diagnosis can be made during:  Routine or annual pelvic examination (common).   Ultrasound.   X-ray of the pelvis.   CT Scan.   MRI.   Blood tests.  TREATMENT   Treatment may only be to follow the cyst monthly for 2 to 3 months with your caregiver. Many go away on their own, especially functional cysts.   May be aspirated (drained) with a long needle with ultrasound, or by laparoscopy  (inserting a tube into the pelvis through a small incision).   The whole cyst can be removed by laparoscopy.   Sometimes the cyst may need to be removed through an incision in the lower abdomen.   Hormone treatment is sometimes used to help dissolve certain cysts.   Birth control pills are sometimes used to help dissolve certain cysts.  HOME CARE INSTRUCTIONS  Follow your caregiver's advice regarding:  Medicine.   Follow up visits to evaluate and treat the cyst.   You may need to come back or make an appointment with another caregiver, to find the exact cause of your cyst, if your caregiver is not a gynecologist.   Get your yearly and recommended pelvic examinations and Pap tests.   Let your caregiver know if you have had an ovarian cyst in the past.  SEEK MEDICAL CARE IF:   Your periods are late, irregular, they stop, or are painful.   Your stomach (abdomen) or pelvic pain does not go away.   Your stomach becomes larger or swollen.   You have pressure on your bladder or trouble emptying your bladder completely.   You have painful sexual intercourse.   You have feelings of fullness, pressure, or discomfort in your stomach.   You lose weight for no apparent reason.   You feel generally ill.   You become constipated.   You lose your appetite.   You develop acne.   You have an increase in body and facial hair.   You are gaining weight, without changing your exercise and eating habits.   You think you are pregnant.  SEEK IMMEDIATE MEDICAL CARE IF:   You have increasing abdominal pain.   You feel sick to your stomach (nausea) and/or vomit.   You develop a fever that comes on suddenly.   You develop abdominal pain during a bowel movement.   Your menstrual periods become heavier than usual.  Document Released: 03/05/2005 Document Revised: 02/22/2011 Document Reviewed: 01/06/2009 [employer] Patient Information 2012 [employer]. 

## 2011-07-10 ENCOUNTER — Inpatient Hospital Stay (HOSPITAL_COMMUNITY)
Admission: AD | Admit: 2011-07-10 | Discharge: 2011-07-10 | Disposition: A | Discharge status: Home / Self Care | Payer: BC Managed Care – PPO | Source: Ambulatory Visit | Attending: Obstetrics & Gynecology | Admitting: Obstetrics & Gynecology

## 2011-07-10 ENCOUNTER — Inpatient Hospital Stay (HOSPITAL_COMMUNITY): Payer: BC Managed Care – PPO

## 2011-07-10 DIAGNOSIS — N83201 Unspecified ovarian cyst, right side: Secondary | ICD-10-CM

## 2011-07-10 DIAGNOSIS — N949 Unspecified condition associated with female genital organs and menstrual cycle: Secondary | ICD-10-CM | POA: Insufficient documentation

## 2011-07-10 DIAGNOSIS — N83209 Unspecified ovarian cyst, unspecified side: Secondary | ICD-10-CM

## 2011-07-10 MED ORDER — METRONIDAZOLE 500 MG PO TABS: 500.0000 mg | ORAL_TABLET | Freq: Three times a day (TID) | ORAL | Status: DC

## 2011-07-10 MED ORDER — KETOROLAC TROMETHAMINE 30 MG/ML IJ SOLN
30.0000 mg | Freq: Once | INTRAMUSCULAR | Status: AC
  Administered 2011-07-10: 30 mg via INTRAMUSCULAR
  Filled 2011-07-10: qty 1

## 2011-07-10 MED ORDER — METRONIDAZOLE 500 MG PO TABS: 500.0000 mg | ORAL_TABLET | Freq: Two times a day (BID) | ORAL | Status: AC

## 2011-07-10 MED ORDER — HYDROCODONE-ACETAMINOPHEN 5-500 MG PO TABS: 1.0000 | ORAL_TABLET | Freq: Four times a day (QID) | ORAL | Status: AC | PRN

## 2011-07-10 NOTE — MAU Note (Signed)
Pt states she was in here about 2wks ago and was diagnosed with an ovarian sight on the right side. Pt states she has been taken Vicodin for the pain and it has just taken the 'edge" off the pain and pt continues to have pain that she rates a 8

## 2011-07-10 NOTE — Discharge Instructions (Signed)
Ovarian Cyst The ovaries are small organs that are on each side of the uterus. The ovaries are the organs that produce the female hormones, estrogen and progesterone. An ovarian cyst is a sac filled with fluid that can vary in its size. It is normal for a small cyst to form in women who are in the childbearing age and who have menstrual periods. This type of cyst is called a follicle cyst that becomes an ovulation cyst (corpus luteum cyst) after it produces the women's egg. It later goes away on its own if the woman does not become pregnant. There are other kinds of ovarian cysts that may cause problems and may need to be treated. The most serious problem is a cyst with cancer. It should be noted that menopausal women who have an ovarian cyst are at a higher risk of it being a cancer cyst. They should be evaluated very quickly, thoroughly and followed closely. This is especially true in menopausal women because of the high rate of ovarian cancer in women in menopause. CAUSES AND TYPES OF OVARIAN CYSTS:  FUNCTIONAL CYST: The follicle/corpus luteum cyst is a functional cyst that occurs every month during ovulation with the menstrual cycle. They go away with the next menstrual cycle if the woman does not get pregnant. Usually, there are no symptoms with a functional cyst.   ENDOMETRIOMA CYST: This cyst develops from the lining of the uterus tissue. This cyst gets in or on the ovary. It grows every month from the bleeding during the menstrual period. It is also called a "chocolate cyst" because it becomes filled with blood that turns brown. This cyst can cause pain in the lower abdomen during intercourse and with your menstrual period.   CYSTADENOMA CYST: This cyst develops from the cells on the outside of the ovary. They usually are not cancerous. They can get very big and cause lower abdomen pain and pain with intercourse. This type of cyst can twist on itself, cut off its blood supply and cause severe pain.  It also can easily rupture and cause a lot of pain.   DERMOID CYST: This type of cyst is sometimes found in both ovaries. They are found to have different kinds of body tissue in the cyst. The tissue includes skin, teeth, hair, and/or cartilage. They usually do not have symptoms unless they get very big. Dermoid cysts are rarely cancerous.   POLYCYSTIC OVARY: This is a rare condition with hormone problems that produces many small cysts on both ovaries. The cysts are follicle-like cysts that never produce an egg and become a corpus luteum. It can cause an increase in body weight, infertility, acne, increase in body and facial hair and lack of menstrual periods or rare menstrual periods. Many women with this problem develop type 2 diabetes. The exact cause of this problem is unknown. A polycystic ovary is rarely cancerous.   THECA LUTEIN CYST: Occurs when too much hormone (human chorionic gonadotropin) is produced and over-stimulates the ovaries to produce an egg. They are frequently seen when doctors stimulate the ovaries for invitro-fertilization (test tube babies).   LUTEOMA CYST: This cyst is seen during pregnancy. Rarely it can cause an obstruction to the birth canal during labor and delivery. They usually go away after delivery.  SYMPTOMS   Pelvic pain or pressure.   Pain during sexual intercourse.   Increasing girth (swelling) of the abdomen.   Abnormal menstrual periods.   Increasing pain with menstrual periods.   You stop having   menstrual periods and you are not pregnant.  DIAGNOSIS  The diagnosis can be made during:  Routine or annual pelvic examination (common).   Ultrasound.   X-ray of the pelvis.   CT Scan.   MRI.   Blood tests.  TREATMENT   Treatment may only be to follow the cyst monthly for 2 to 3 months with your caregiver. Many go away on their own, especially functional cysts.   May be aspirated (drained) with a long needle with ultrasound, or by laparoscopy  (inserting a tube into the pelvis through a small incision).   The whole cyst can be removed by laparoscopy.   Sometimes the cyst may need to be removed through an incision in the lower abdomen.   Hormone treatment is sometimes used to help dissolve certain cysts.   Birth control pills are sometimes used to help dissolve certain cysts.  HOME CARE INSTRUCTIONS  Follow your caregiver's advice regarding:  Medicine.   Follow up visits to evaluate and treat the cyst.   You may need to come back or make an appointment with another caregiver, to find the exact cause of your cyst, if your caregiver is not a gynecologist.   Get your yearly and recommended pelvic examinations and Pap tests.   Let your caregiver know if you have had an ovarian cyst in the past.  SEEK MEDICAL CARE IF:   Your periods are late, irregular, they stop, or are painful.   Your stomach (abdomen) or pelvic pain does not go away.   Your stomach becomes larger or swollen.   You have pressure on your bladder or trouble emptying your bladder completely.   You have painful sexual intercourse.   You have feelings of fullness, pressure, or discomfort in your stomach.   You lose weight for no apparent reason.   You feel generally ill.   You become constipated.   You lose your appetite.   You develop acne.   You have an increase in body and facial hair.   You are gaining weight, without changing your exercise and eating habits.   You think you are pregnant.  SEEK IMMEDIATE MEDICAL CARE IF:   You have increasing abdominal pain.   You feel sick to your stomach (nausea) and/or vomit.   You develop a fever that comes on suddenly.   You develop abdominal pain during a bowel movement.   Your menstrual periods become heavier than usual.  Document Released: 03/05/2005 Document Revised: 02/22/2011 Document Reviewed: 01/06/2009 [employer] Patient Information 2012 [employer]. 

## 2011-07-10 NOTE — MAU Provider Note (Signed)
[name] y.o.G3P2010 @Unknown  by LMP Chief Complaint  Patient presents with  . Abdominal Pain     First Provider Initiated Contact with Patient 07/10/11 1754      SUBJECTIVE  HPI: Presents with worsening right pelvic pain and known cyst. She was seen here 12 days ago to have a 4.8 cm right ovarian simple cyst. That she has a Mirena in place she states she often gets ovarian cysts. She was taking Vicodin along with ibuprofen which was helping until last night. Since then she's had continuous pain despite taking ibuprofen and Vicodin on schedule. Does not have an appointment for GYN clinic yet. Had BV by wet prep at last visit that was not treated. 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    Past Medical History  Diagnosis Date  . ADD (attention deficit disorder with hyperactivity)   . Anxiety   . Depression   . Ovarian cyst    Past Surgical History  Procedure Date  . Cystectomy     twice  . Wisdom tooth extraction    History   Social History  . Marital Status: Legally Separated    Spouse Name: N/A    Number of Children: N/A  . Years of Education: N/A   Occupational History  . Not on file.   Social History Main Topics  . Smoking status: Current Everyday Smoker -- 0.5 packs/day    Types: Cigarettes  . Smokeless tobacco: Never Used  . Alcohol Use: Yes  . Drug Use: No  . Sexually Active: Yes    Birth Control/ Protection: IUD   Other Topics Concern  . Not on file   Social History Narrative  . No narrative on file   No current facility-administered medications on file prior to encounter.   Current Outpatient Prescriptions on File Prior to Encounter  Medication Sig Dispense Refill  . amphetamine-dextroamphetamine (ADDERALL) 10 MG tablet Take 10 mg by mouth 3 (three) times daily.       Marland Kitchen buPROPion (WELLBUTRIN SR) 150 MG 12 hr tablet Take 150 mg by mouth 2 (two) times daily.      . clonazePAM (KLONOPIN) 1 MG tablet Take 1  mg by mouth 3 (three) times daily as needed. For anxiety      . ibuprofen (ADVIL,MOTRIN) 200 MG tablet Take 400-800 mg by mouth every 6 (six) hours as needed. For pain       Allergies  Allergen Reactions  . Pork-Derived Products Anaphylaxis, Hives and Swelling    ROS: Pertinent items in HPI  OBJECTIVE Blood pressure 129/76, pulse 94, temperature 97.5 F (36.4 C), temperature source Oral, resp. rate 16, height 5\' 3"  (1.6 m), weight 70.217 kg (154 lb 12.8 oz), last menstrual period 05/16/2011, SpO2 100.00%.  GENERAL: Well-developed, well-nourished female in no acute distress.  HEENT: Normocephalic, good dentition HEART: normal rate RESP: normal effort ABDOMEN: Soft, mild-mod tenderness rt suprapubic region, no mass palpable EXTREMITIES: Nontender, no edema NEURO: Alert and oriented    LAB RESULTS    IMAGING   ASSESSMENT Pelvic pain with right ovarian cyst  PLAN Rx Flagyl. Contnue ibuprofen and Vicodin Work excuse given F/U GYN Clinic    [name] 07/10/2011 6:18 PM

## 2011-07-10 NOTE — MAU Note (Signed)
Patient states she was diagnosed with an ovarian cyst on the right side on 4-10. States the pain medication is not helping and the pain is getting worse. Some nausea, no vomiting. Has a Mirena IUD.

## 2011-07-11 NOTE — MAU Provider Note (Signed)
Attestation of Attending Supervision of Advanced Practitioner: Evaluation and management procedures were performed by the PA/NP/CNM/OB Fellow under my supervision/collaboration. Chart reviewed and agree with management and plan.  Chiara Coltrin V 07/11/2011 8:26 PM    

## 2011-08-09 ENCOUNTER — Ambulatory Visit: Payer: BC Managed Care – PPO | Admitting: Obstetrics & Gynecology

## 2012-01-21 ENCOUNTER — Telehealth: Payer: Self-pay | Admitting: *Deleted

## 2012-01-21 MED ORDER — METRONIDAZOLE 500 MG PO TABS: ORAL_TABLET | ORAL | Status: DC

## 2012-01-21 MED ORDER — HYDROCODONE-ACETAMINOPHEN 5-500 MG PO TABS: 1.0000 | ORAL_TABLET | ORAL | Status: AC | PRN

## 2012-01-21 NOTE — Telephone Encounter (Signed)
Pt left message stating that she has pain on her Rt side and has HX of ovarian cyst. She also thinks she has BV and has a Mirena IUD. She would like Rx for pain and BV. Call cell # [phone] or work # [phone].  I returned pt's call and she stated that she is having d/c that was not relieved with OTC med for yeast. I told her that I will call in Rx for Flagyl. Also, pt states that she has been taking ibuprofen 800 mg every 8 hrs for her pain and it is only minimally relieved. i told her that the doctor approved a small amount of Vicodin which she has had in the past. Pt also needs Gyn clinic follow up for her pain. She had an appt last May and did not show. Pt agrees to appt. I stated that someone will call her in the next couple days with appt info.  Pt voiced understanding.

## 2012-02-06 ENCOUNTER — Ambulatory Visit: Payer: Self-pay | Admitting: Obstetrics & Gynecology

## 2012-05-09 ENCOUNTER — Other Ambulatory Visit: Payer: Self-pay | Admitting: Internal Medicine

## 2012-05-13 ENCOUNTER — Other Ambulatory Visit: Payer: Self-pay

## 2013-04-27 ENCOUNTER — Encounter (INDEPENDENT_AMBULATORY_CARE_PROVIDER_SITE_OTHER): Payer: Self-pay

## 2013-04-27 ENCOUNTER — Ambulatory Visit (INDEPENDENT_AMBULATORY_CARE_PROVIDER_SITE_OTHER): Payer: BC Managed Care – PPO | Admitting: Neurology

## 2013-04-27 ENCOUNTER — Encounter: Payer: Self-pay | Admitting: Neurology

## 2013-04-27 VITALS — BP 115/75 | HR 82 | Temp 97.7°F | Ht 63.75 in | Wt 140.0 lb

## 2013-04-27 DIAGNOSIS — R519 Headache, unspecified: Secondary | ICD-10-CM

## 2013-04-27 DIAGNOSIS — Z87820 Personal history of traumatic brain injury: Secondary | ICD-10-CM

## 2013-04-27 DIAGNOSIS — R51 Headache: Secondary | ICD-10-CM

## 2013-04-27 MED ORDER — AMITRIPTYLINE HCL 25 MG PO TABS: ORAL_TABLET | ORAL | Status: DC

## 2013-04-27 NOTE — Patient Instructions (Addendum)
Please remember, common headache triggers are: sleep deprivation, dehydration, overheating, stress, hypoglycemia or skipping meals and blood sugar fluctuations, excessive pain medications or excessive alcohol use or caffeine withdrawal. Some people have food triggers such as aged cheese, orange juice or chocolate, especially dark chocolate, or MSG (monosodium glutamate). Try to avoid these headache triggers as much possible. It may be helpful to keep a headache diary to figure out what makes your headaches worse or brings them on and what alleviates them. Some people report headache onset after exercise but studies have shown that regular exercise may actually prevent headaches from coming. If you have exercise-induced headaches, please make sure that you drink plenty of fluid before and after exercising and that you do not over do it and do not overheat.  1. Please taper off of depakote by taking it one pill at night only for the rest of this week, then stop.  2. Please reduce your caffeine intake, especially after 6 PM.  3. Increase your water intake.  4. Allow for 8 hours of sleep each night.  5. Once you are off of the Depakote, we will try Elavil (generic name: amitriptyline) 25 mg: Take half a pill daily at bedtime for one week, then one pill daily at bedtime for one week, then one and a half pills daily at bedtime for one week, then 2 pills daily at bedtime thereafter. Common side effects reported are: mouth dryness, drowsiness, confusion, dizziness, blurry vision. Rare and serious side effect may include an increase in depression and suicidal thoughts.   6. We will call you to set up your MRI, Luster LandsbergRenee will call you.
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
## 2013-05-06 ENCOUNTER — Other Ambulatory Visit: Payer: BC Managed Care – PPO

## 2013-05-27 ENCOUNTER — Ambulatory Visit (INDEPENDENT_AMBULATORY_CARE_PROVIDER_SITE_OTHER): Payer: BC Managed Care – PPO

## 2013-05-27 ENCOUNTER — Encounter (INDEPENDENT_AMBULATORY_CARE_PROVIDER_SITE_OTHER): Payer: Self-pay

## 2013-05-27 ENCOUNTER — Other Ambulatory Visit: Payer: BC Managed Care – PPO

## 2013-05-27 DIAGNOSIS — Z87820 Personal history of traumatic brain injury: Secondary | ICD-10-CM

## 2013-05-27 DIAGNOSIS — R51 Headache: Secondary | ICD-10-CM

## 2013-05-27 DIAGNOSIS — R519 Headache, unspecified: Secondary | ICD-10-CM

## 2013-05-28 NOTE — Progress Notes (Signed)
Quick Note:  Please advise patient that her brain MRI without contrast was reported as normal. Huston FoleySaima Joeli Fenner, MD, PhD [facility] ([facility]  ______

## 2013-07-27 ENCOUNTER — Ambulatory Visit: Payer: BC Managed Care – PPO | Admitting: Neurology

## 2014-01-18 ENCOUNTER — Encounter: Payer: Self-pay | Admitting: Neurology

## 2014-02-06 IMAGING — US US TRANSVAGINAL NON-OB
1 series · 13 of 25 positions shown · non-contrast
Comparison: Pelvic ultrasound performed 01/26/2007

CLINICAL DATA: Pelvic pain.

TRANSABDOMINAL AND TRANSVAGINAL ULTRASOUND OF PELVIS
TECHNIQUE: Both transabdominal and transvaginal ultrasound
examinations of the pelvis were performed. Transabdominal technique
was performed for global imaging of the pelvis including uterus,
ovaries, adnexal regions, and pelvic cul-de-sac.

[Series 1: us pelvis complete · 13 of 50 slices shown]
[im 1/50]
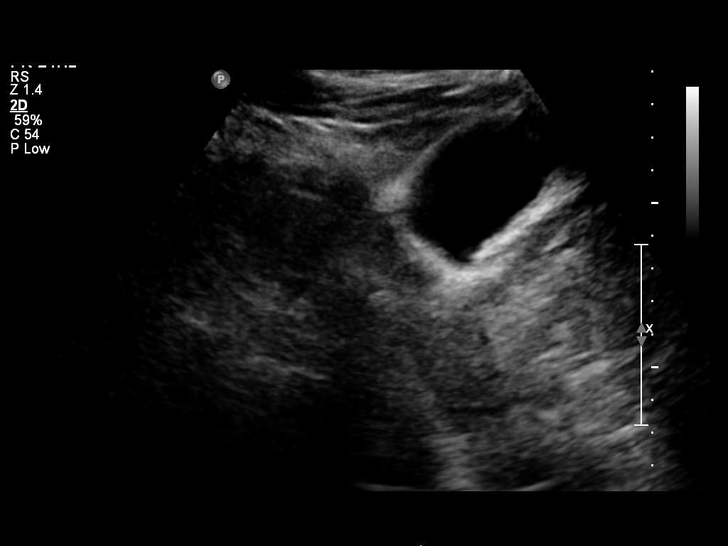
[im 5/50]
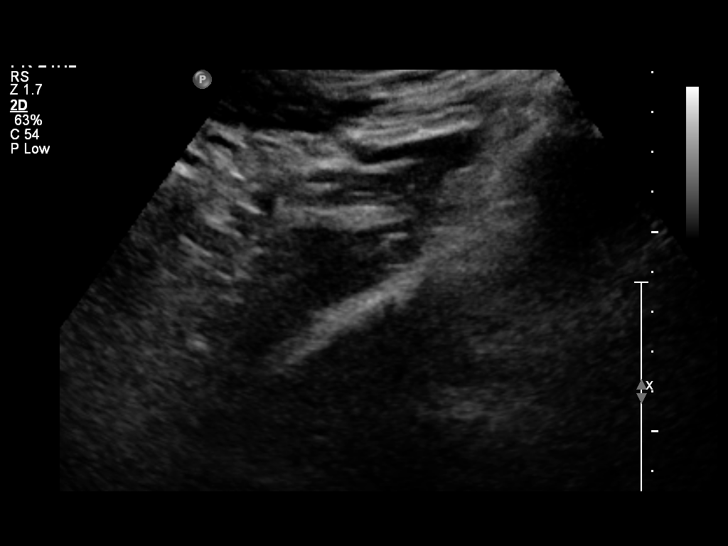
[im 9/50]
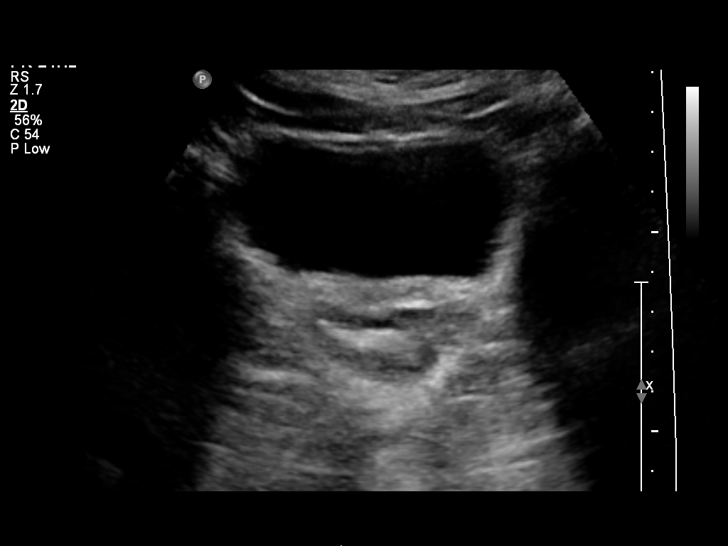
[im 13/50]
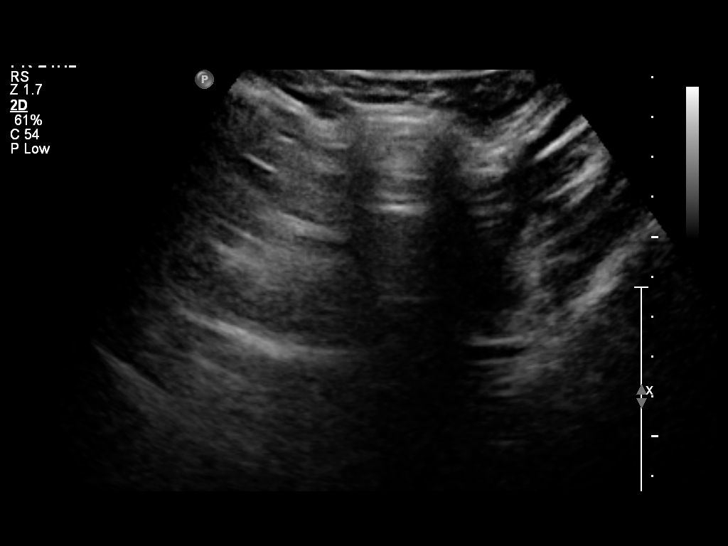
[im 17/50]
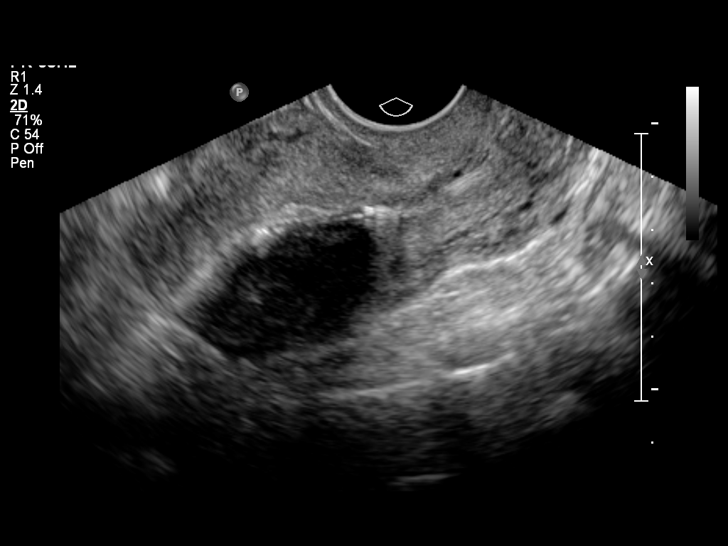
[im 21/50]
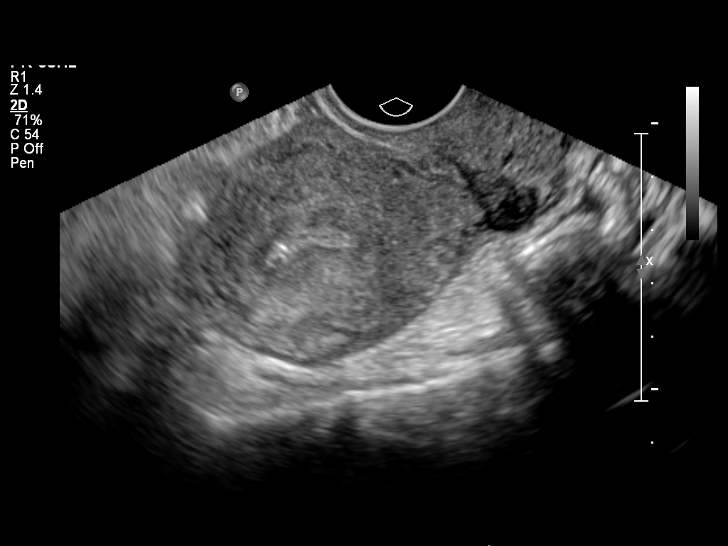
[im 25/50]
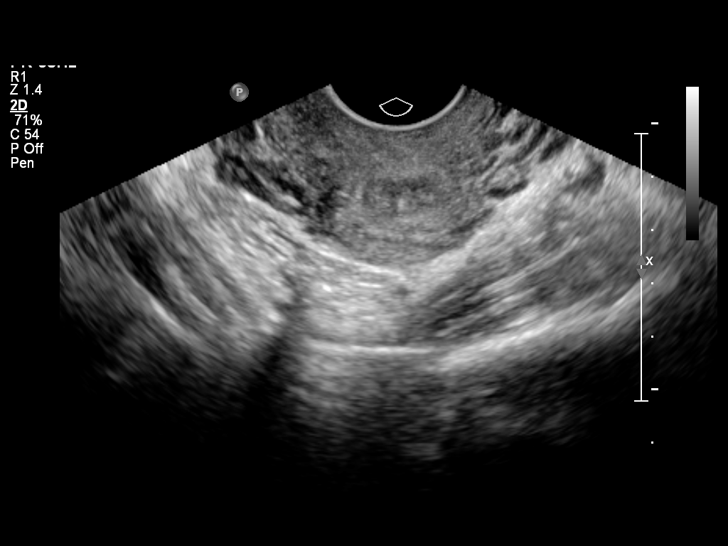
[im 29/50]
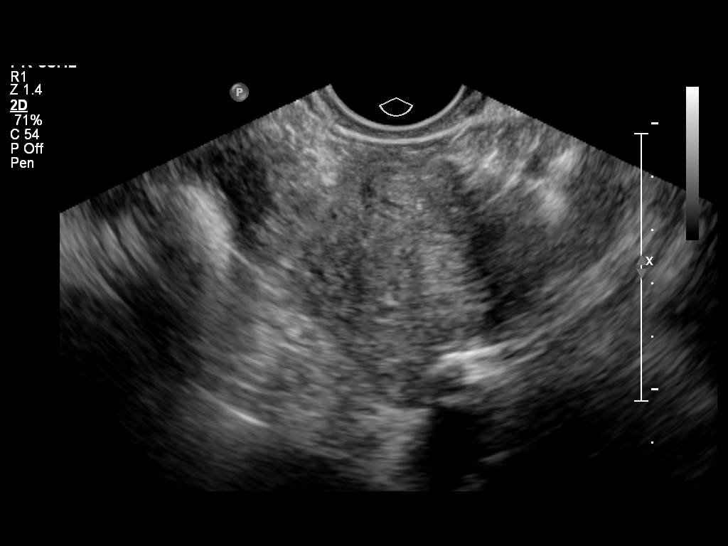
[im 33/50]
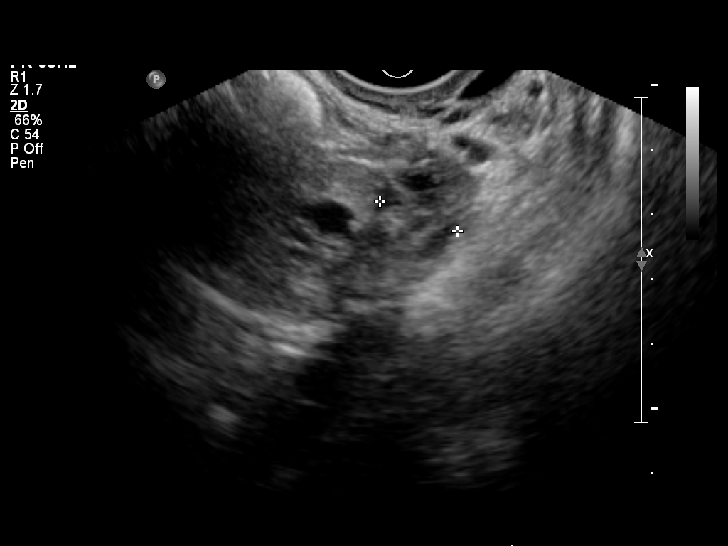
[im 37/50]
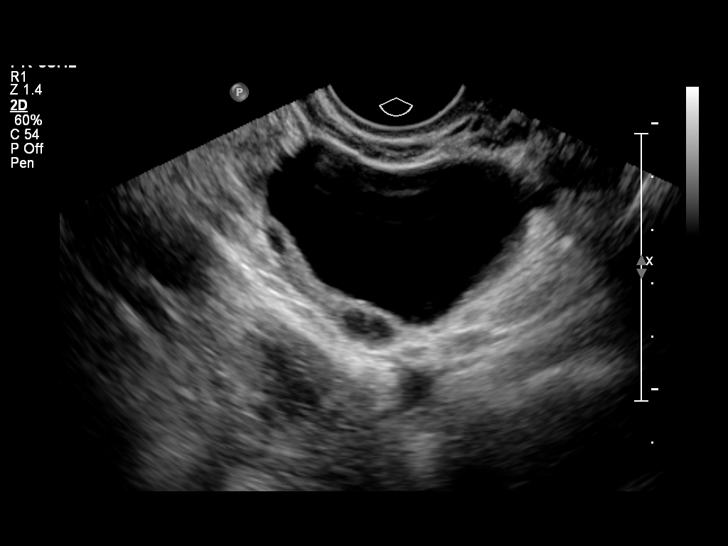
[im 41/50]
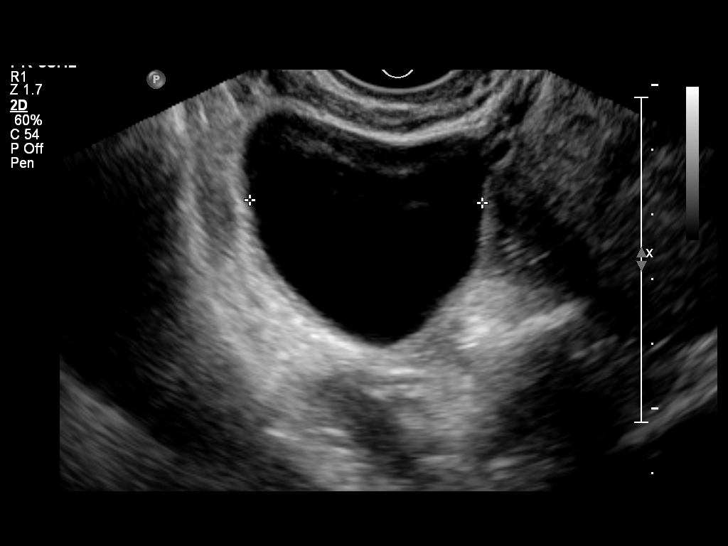
[im 45/50]
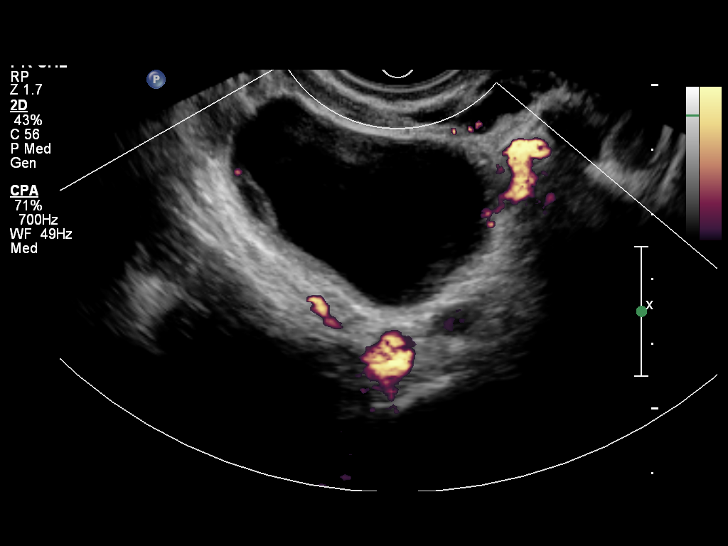
[im 50/50]
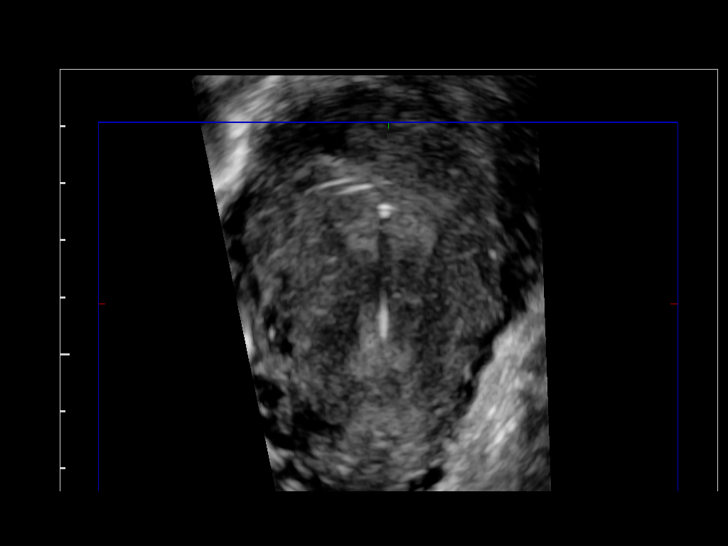

[13 of 25 positions shown; findings below may reference images not displayed]

It was necessary to proceed with endovaginal exam following the
transabdominal exam to visualize the uterus and ovaries in greater
detail.
FINDINGS: Uterus: Normal in size and appearance; measures 8.7 x 4.3 x 4.7 cm.

Endometrium: Normal in thickness and appearance; measures 0.6 cm in
thickness.  The patient's intrauterine device is noted in expected
position, at the fundus of the uterus.

Right ovary:  The right ovary measures 5.8 x 3.8 x 4.1 cm.  There
is a simple-appearing right ovarian cyst, measuring 4.8 x 2.9 x
cm.  Limited Doppler evaluation demonstrates normal color Doppler
blood flow with respect to right ovarian tissue; there is no
evidence for ovarian torsion.

Left ovary: Normal appearance/no adnexal mass; measures 2.7 x 1.6 x
1.3 cm.

Other findings: No free fluid seen within the pelvic cul-de-sac.
IMPRESSION: 1.  Uterus unremarkable in appearance; intrauterine device noted in
expected position.
2.  Simple appearing 4.8 cm right adnexal cyst noted.  No evidence
for ovarian torsion.  This is almost certainly benign, and no
specific imaging follow up is recommended according to the Society
of Radiologists in Ultrasound 1353 Consensus  Conference Statement
(Pola My et al. Management of Asymptomatic Ovarian and Other
Adnexal Cysts Imaged at US:  Society of Radiologists in Ultrasound
Consensus Conference Statement 1353.  Radiology [DATE]):

## 2014-02-18 IMAGING — US US TRANSVAGINAL NON-OB
1 series · 14 of 25 positions shown · non-contrast
Comparison: 06/28/2011

CLINICAL DATA: Right-sided pelvic pain.  Prior right ovarian cyst

TRANSVAGINAL ULTRASOUND OF PELVIS
TECHNIQUE: Transvaginal ultrasound examination of the pelvis was
performed including evaluation of the uterus, ovaries, adnexal
regions, and pelvic cul-de-sac.

[Series 1: us transvaginal non-ob · 46 acquisitions, 14 frames shown]
[im 1/46]
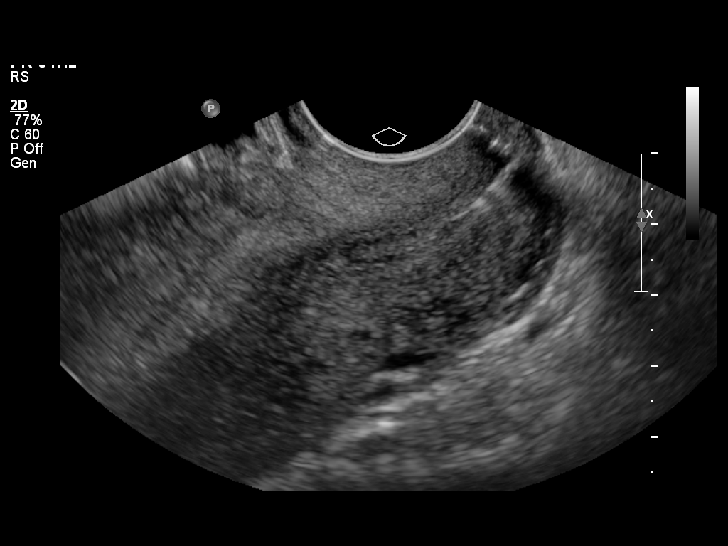
[im 4/46]
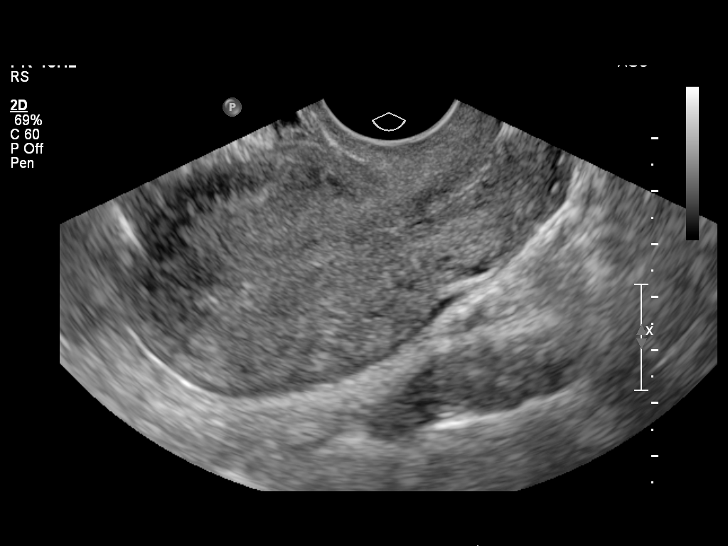
[im 8/46]
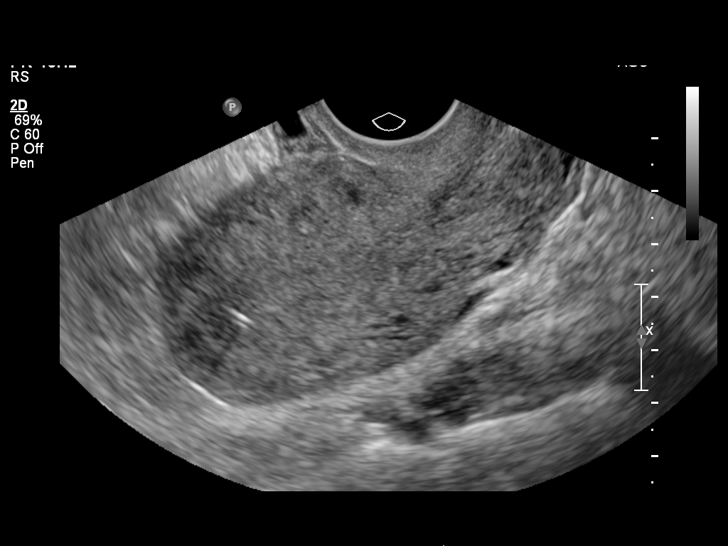
[im 12/46]
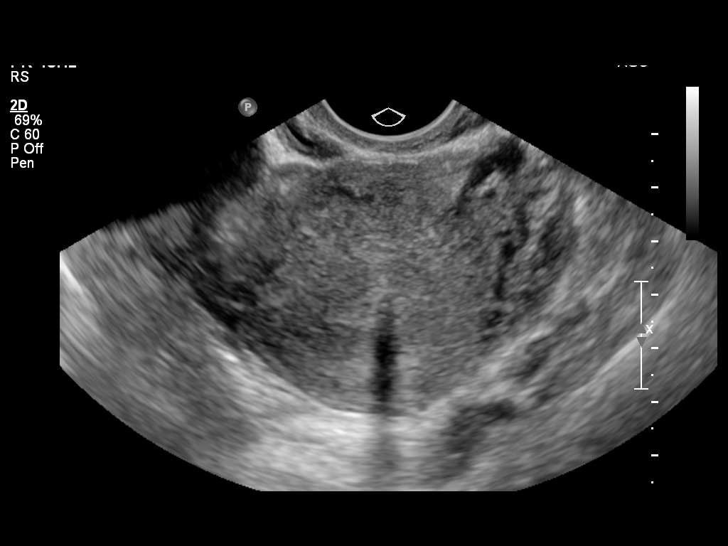
[im 16/46]
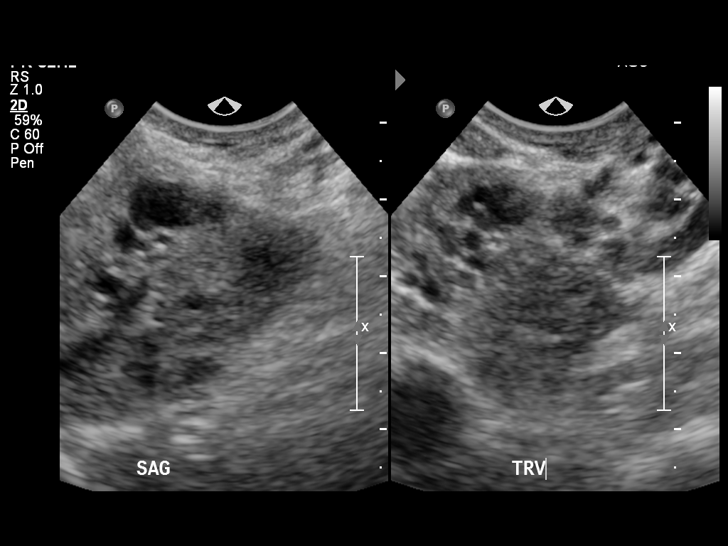
[im 17/46]
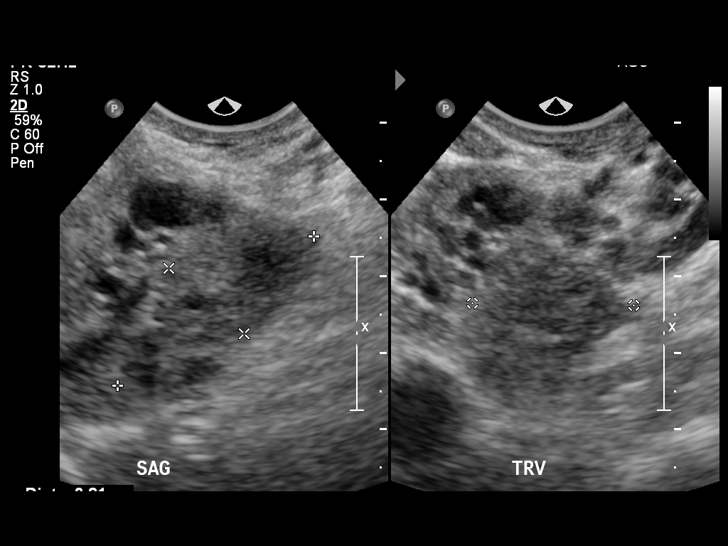
[im 21/46]
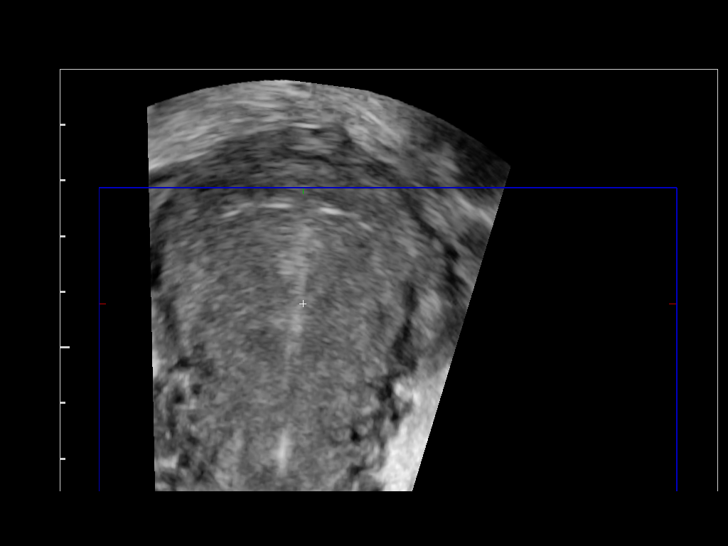
[im 25/46]
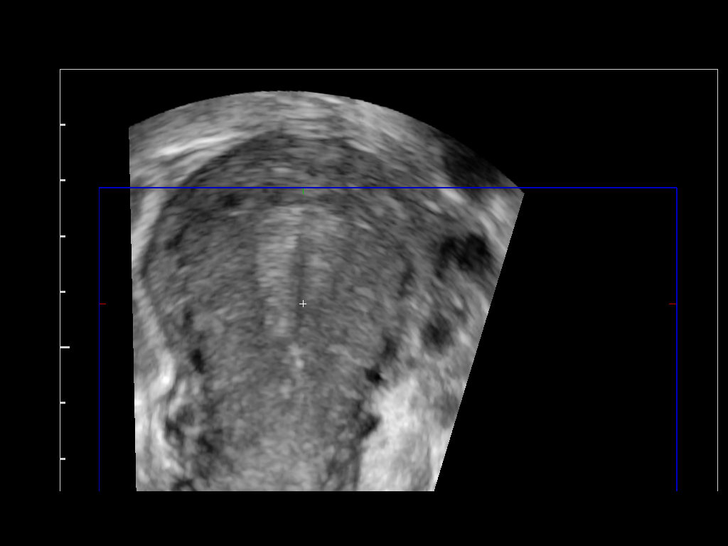
[im 29/46]
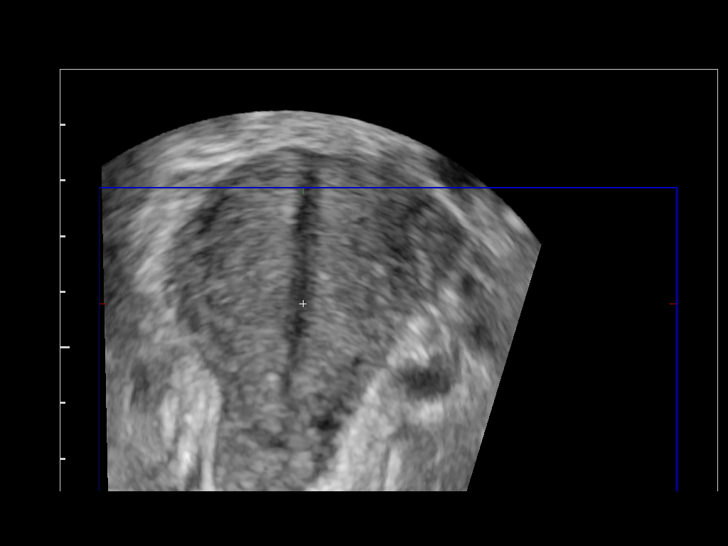
[im 31/46]
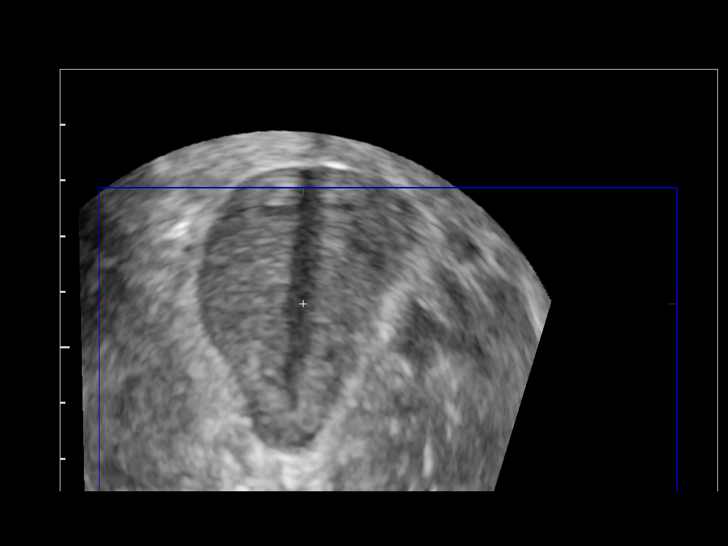
[im 34/46]
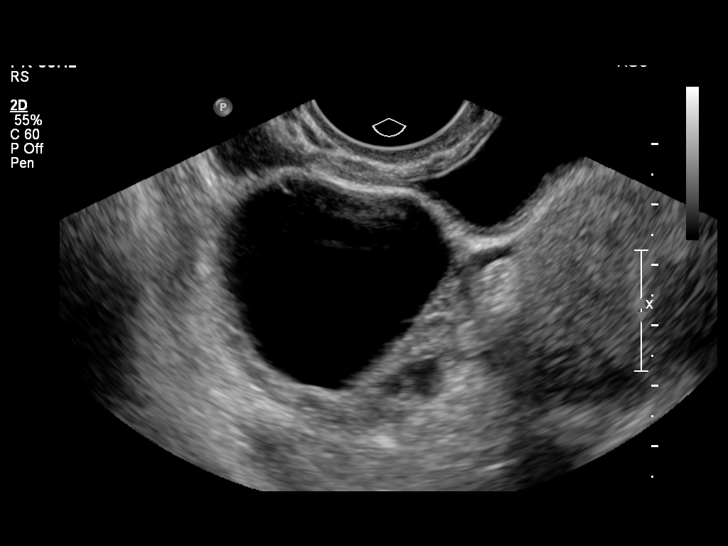
[im 38/46]
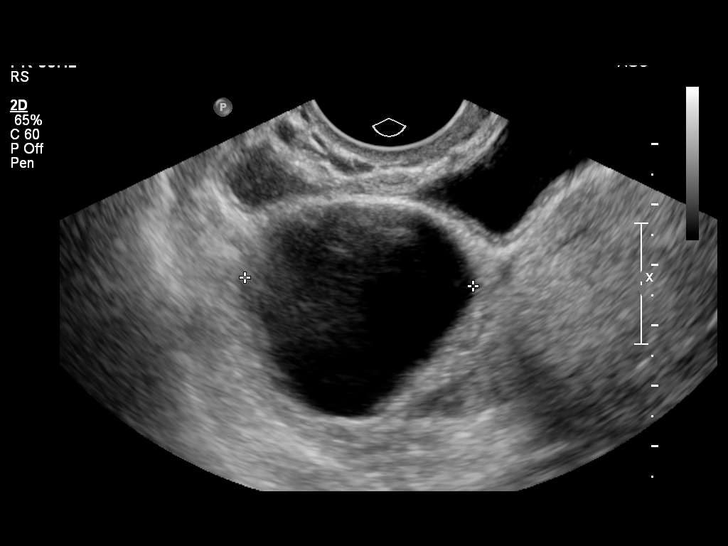
[im 42/46]
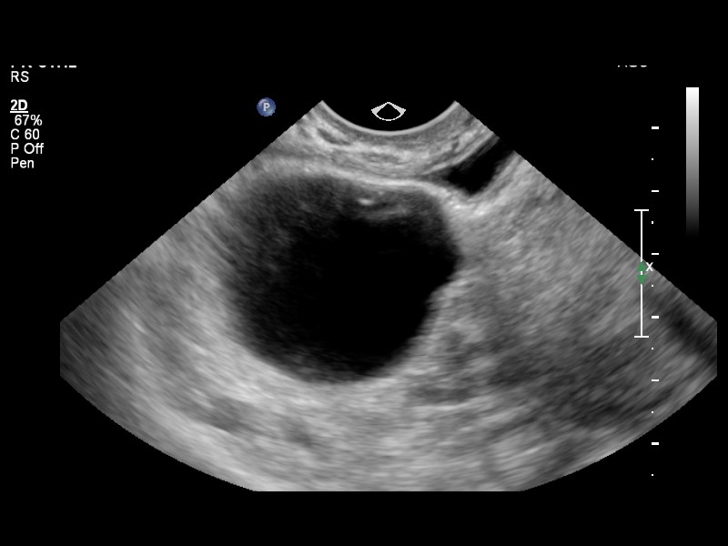
[im 46/46]
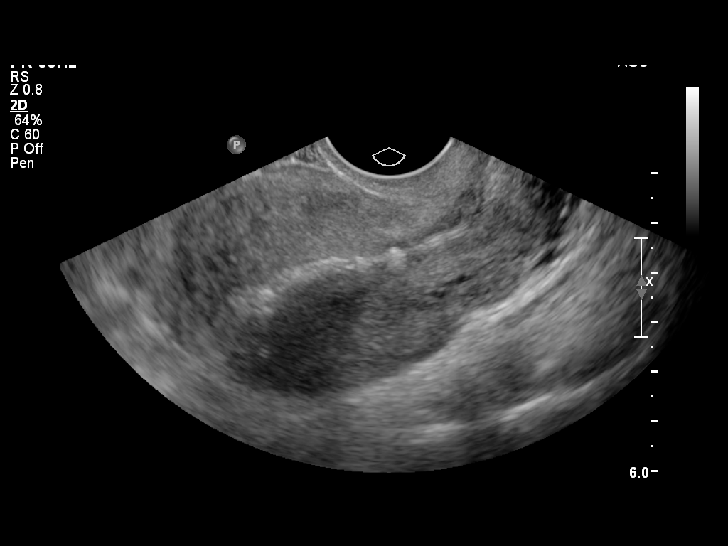

[14 of 25 positions shown; findings below may reference images not displayed]

FINDINGS: Uterus:  8.3 x 5.8 x 4.3 cm.  Anteverted, anteflexed.  No focal
abnormality.

Endometrium: 7 mm.  IUD appropriately positioned within the uterine
fundus/body endometrial canal.

Right ovary: 4.4 x 4.4 x 4.0 cm.  Simple appearing cyst 3.7 x 3.8 x
3.4 cm (previously 4.8 x 3.6 x 2.9 cm.)

Left ovary: 3.2 x 2.1 x 1.3 cm.  Normal.

Other Findings:  Trace free fluid noted in the cul-de-sac.
IMPRESSION: Decrease in size of simple appearing right ovarian cyst.  No
further specific follow-up is needed unless clinically warranted
due to focal symptoms.

No new acute abnormality.

## 2015-09-20 ENCOUNTER — Emergency Department (HOSPITAL_COMMUNITY)
Admission: EM | Admit: 2015-09-20 | Discharge: 2015-09-20 | Discharge status: Against Medical Advice | Payer: Self-pay | Attending: Emergency Medicine | Admitting: Emergency Medicine

## 2015-09-20 ENCOUNTER — Ambulatory Visit (HOSPITAL_COMMUNITY): Admission: EM | Admit: 2015-09-20 | Discharge: 2015-09-20 | Discharge status: Absent without leave | Payer: Self-pay

## 2015-09-20 ENCOUNTER — Encounter (HOSPITAL_COMMUNITY): Payer: Self-pay | Admitting: Emergency Medicine

## 2015-09-20 DIAGNOSIS — F17210 Nicotine dependence, cigarettes, uncomplicated: Secondary | ICD-10-CM | POA: Insufficient documentation

## 2015-09-20 DIAGNOSIS — H5711 Ocular pain, right eye: Secondary | ICD-10-CM | POA: Insufficient documentation

## 2015-09-20 DIAGNOSIS — Z79899 Other long term (current) drug therapy: Secondary | ICD-10-CM | POA: Insufficient documentation

## 2015-09-20 MED ORDER — FLUORESCEIN SODIUM 1 MG OP STRP
1.0000 | ORAL_STRIP | Freq: Once | OPHTHALMIC | Status: DC
  Filled 2015-09-20: qty 1

## 2015-09-20 MED ORDER — TETRACAINE HCL 0.5 % OP SOLN
1.0000 [drp] | Freq: Once | OPHTHALMIC | Status: AC
  Administered 2015-09-20: 1 [drp] via OPHTHALMIC
  Filled 2015-09-20: qty 2

## 2015-09-20 NOTE — ED Notes (Signed)
Pt here with irritation to right eye. PT reports tearing and pain to eye. No known injury to eye.

## 2015-09-20 NOTE — ED Notes (Addendum)
Pt asked to speak to RN.  Refused signing consent for treatment from Registration, did not want to speak to Pharmacy tech about her medications.  Pt sts she does not want the NP addressing her eye complaint.  Pt was under the assumption that she would be able to be seen directly by an opthalmologist.  Pt yelling at RN and stating we have not treated her concerns.  Ice pack provided.  Pt continues to insist she will not be touched, assessed, or treated by anyone by an eye doctor.  This RN attempted to explain that Opthalmology cannot be contacted until pt has been initially assessed by NP.  Pt sts she was spoken to about all of this already, and does not want any additional treatment.  This RN asked if she would be willing to sign the AMA form, and explained entire rationale for AMA form, and pt refused signature.  RN signed and pt escorted to lobby.

## 2015-09-20 NOTE — ED Provider Notes (Signed)
CSN: [id number]     Arrival date & time 09/20/15  1611 History  By signing my name below, I, Soijett Blue, attest that this documentation has been prepared under the direction and in the presence of Kerrie BuffaloHope Shunte Senseney, NP Electronically Signed: Soijett Blue, ED Scribe. 09/20/2015. 5:14 PM.   Chief Complaint  Patient presents with  . Eye Problem      Patient is a 30 y.o. female presenting with eye problem. The history is provided by the patient. No language interpreter was used.  Eye Problem Location:  R eye Quality:  Tearing (throbbing) Severity:  Moderate Onset quality:  Sudden Duration:  1 day Timing:  Constant Progression:  Unchanged Chronicity:  New Context: not direct trauma   Relieved by:  None tried Worsened by:  Nothing tried Ineffective treatments:  None tried Associated symptoms: tearing   Associated symptoms: no discharge, no foreign body sensation and no photophobia     HPI Comments: [name] is a 30 y.o. female who presents to the Emergency Department complaining of right eye problem onset today. Patient reports taking out her contact lenses last night and this am having eye irritation.  Pt notes that she attempted to put her contacts in earlier today when she was unable to open her right eye. She went to the water park and wore her sunglasses. She continues to have right eye irritation. Pt denies having her contacts in at this time. Denies any recent injury or trauma. Pt denies a foreign body sensation to her right eye. She states that she is having associated symptoms of right eye irritation, watery right eye, and throbbing right eye pain. She states that she has not tried any medications for the relief for her symptoms. She denies FB sensation to right eye, fever, chills, and any other symptoms.     Past Medical History  Diagnosis Date  . ADD (attention deficit disorder with hyperactivity)   . Anxiety   . Depression   . Ovarian cyst    Past Surgical History   Procedure Laterality Date  . Cystectomy      twice  . Wisdom tooth extraction     Family History  Problem Relation Age of Onset  . Multiple sclerosis Mother    Social History  Substance Use Topics  . Smoking status: Current Every Day Smoker -- 0.50 packs/day    Types: Cigarettes  . Smokeless tobacco: Never Used  . Alcohol Use: Yes     Comment: socially   OB History    Gravida Para Term Preterm AB TAB SAB Ectopic Multiple Living   3 2 2  1 1          Review of Systems  Constitutional: Negative for fever and chills.  Eyes: Positive for pain (right). Negative for photophobia and discharge.       Watery right eye. Irritation to right eye. No FB sensation.   All other systems reviewed and are negative.     Allergies  Pork-derived products  Home Medications   Prior to Admission medications   Medication Sig Start Date End Date Taking? Authorizing Provider  amitriptyline (ELAVIL) 25 MG tablet 1/2 pill each bedtime x 1 week, then 1 pill nightly x 1 week, then 1 1/2 pills nightly x 1 week, then 2 pills nightly thereafter. 04/27/13   Huston FoleySaima Athar, MD  amphetamine-dextroamphetamine (ADDERALL) 10 MG tablet Take 10 mg by mouth 3 (three) times daily.     Historical Provider, MD  buPROPion [facility](WELLBUTRIN SR) 150 MG  12 hr tablet Take 150 mg by mouth 2 (two) times daily.    Historical Provider, MD  clonazePAM (KLONOPIN) 1 MG tablet Take 1 mg by mouth 3 (three) times daily as needed. For anxiety    Historical Provider, MD  divalproex (DEPAKOTE) 250 MG DR tablet Take 2 tablets by mouth daily. 04/25/13   Historical Provider, MD  HYDROcodone-acetaminophen (NORCO) 5-325 MG per tablet Take 1 tablet by mouth every 6 (six) hours as needed. For pain    Historical Provider, MD  ibuprofen (ADVIL,MOTRIN) 200 MG tablet Take 400-800 mg by mouth every 6 (six) hours as needed. For pain    Historical Provider, MD  levonorgestrel (MIRENA) 20 MCG/24HR IUD 1 each by Intrauterine route once.    Historical Provider,  MD  metroNIDAZOLE (FLAGYL) 500 MG tablet Take 1 tablet two (2) times daily with food 01/21/12   Willodean Rosenthalarolyn Harraway-Smith, MD  propranolol (INDERAL) 80 MG tablet Take 2 tablets by mouth daily. 04/11/13   Historical Provider, MD   BP 137/88 mmHg  Pulse 103  Temp(Src) 98.3 F (36.8 C) (Oral)  Resp 18  Ht 5\' 4"  (1.626 m)  Wt 55.792 kg  BMI 21.10 kg/m2  SpO2 100%  LMP 07/29/2015 Physical Exam  Constitutional: She is oriented to person, place, and time. She appears well-developed and well-nourished. No distress.  HENT:  Head: Normocephalic and atraumatic.  Eyes: EOM are normal. Right conjunctiva is injected.  Neck: Neck supple.  Cardiovascular: Tachycardia present.   Pulmonary/Chest: Effort normal. No respiratory distress.  Musculoskeletal: Normal range of motion.  Neurological: She is alert and oriented to person, place, and time.  Skin: Skin is warm and dry.  Nursing note and vitals reviewed.   ED Course  Procedures (including critical care time) Tetracaine opth 2 drops instilled right eye  DIAGNOSTIC STUDIES: Oxygen Saturation is 100% on RA, nl by my interpretation.    COORDINATION OF CARE: 5:11 PM Discussed treatment plan with pt at bedside and pt agreed to plan. Explained to the patient that we would instill Tetracaine Opth drops to the right eye then stain the eye to look for abrasion, flip her upper lid to look for any foreign body and after assessment would call opthalmology if indicated.    5:27 PM-Pt informed nurse that she would rather be seen by an ophthalmologist and not have her eye checked by the provider.    5:30 PM- Prior to getting the pt hx, she was in exam room reading on her phone and doesn't appear to be in any acute distress.  5:43 PM-Pt came out of the room and stated that she thought there was an ophthalmologist in the ED, and she wanted to leave due to there being no ophthalmologist present. Explained that we only have on call. Patient does not want to wait  or have further evaluation. Left AMA   MDM   Final diagnoses:  Eye pain, right   I personally performed the services described in this documentation, which was scribed in my presence. The recorded information has been reviewed and is accurate.    [address]Akil Hoos IberiaM Cherree Conerly, NP 09/20/15 [id number]  Leta BaptistEmily Roe Nguyen, MD 09/22/15 1723

## 2015-09-20 NOTE — ED Notes (Signed)
Attempted visual acuity but patient states she can't see anything on the chart.  Asked patient to try with unaffected eye, patient states "I cant see anything on the chart, it hurts to look at it".  RN and PA notified.

## 2016-10-20 ENCOUNTER — Encounter (HOSPITAL_COMMUNITY): Payer: Self-pay | Admitting: Emergency Medicine

## 2016-10-20 ENCOUNTER — Ambulatory Visit (HOSPITAL_COMMUNITY)
Admission: EM | Admit: 2016-10-20 | Discharge: 2016-10-20 | Disposition: A | Discharge status: Home / Self Care | Payer: BLUE CROSS/BLUE SHIELD

## 2016-10-20 DIAGNOSIS — K0889 Other specified disorders of teeth and supporting structures: Secondary | ICD-10-CM

## 2016-10-20 DIAGNOSIS — K047 Periapical abscess without sinus: Secondary | ICD-10-CM

## 2016-10-20 MED ORDER — HYDROCODONE-ACETAMINOPHEN 5-325 MG PO TABS: 1.0000 | ORAL_TABLET | ORAL | 0 refills | Status: AC | PRN

## 2016-10-20 MED ORDER — AMOXICILLIN 875 MG PO TABS: 875.0000 mg | ORAL_TABLET | Freq: Two times a day (BID) | ORAL | 0 refills | Status: AC

## 2016-10-20 NOTE — Discharge Instructions (Signed)
Please call Dentist Monday and follow up

## 2016-10-20 NOTE — ED Provider Notes (Signed)
  [facility]   [id number] 10/20/16 Arrival Time: 1608  ASSESSMENT & PLAN:  1. Pain, dental   2. Dental abscess     Meds ordered this encounter  Medications  . diazepam (VALIUM) 10 MG tablet    Sig: Take 10 mg by mouth every 6 (six) hours as needed for anxiety.  Marland Kitchen. amoxicillin (AMOXIL) 875 MG tablet    Sig: Take 1 tablet (875 mg total) by mouth 2 (two) times daily.    Dispense:  20 tablet    Refill:  0    Order Specific Question:   Supervising Provider    Answer:   Eustace MooreMURRAY, LAURA W [[id number]]  . HYDROcodone-acetaminophen (NORCO/VICODIN) 5-325 MG tablet    Sig: Take 1-2 tablets by mouth every 4 (four) hours as needed.    Dispense:  12 tablet    Refill:  0    Order Specific Question:   Supervising Provider    Answer:   Eustace MooreMURRAY, LAURA W [[id number]]   List of affordable Dentist given Advised to call Dentist Monday  Reviewed expectations re: course of current medical issues. Questions answered. Outlined signs and symptoms indicating need for more acute intervention. Patient verbalized understanding. After Visit Summary given.   SUBJECTIVE:  Linton FlemingsMelissa A Bays is a 31 y.o. female who presents with complaint of right lower tooth and jaw pain.  She has a fractured tooth that is infected.  ROS: As per HPI.   OBJECTIVE:  Vitals:   10/20/16 1622  BP: 119/73  Pulse: 82  Resp: 20  Temp: 98.5 F (36.9 C)  TempSrc: Oral  SpO2: 100%     General appearance: alert; no distress HEENT oral right 2nd molar with crack and abscess Neck: supple Lungs: clear to auscultation bilaterally Heart: regular rate and rhythm.. Neurologic: normal symmetric reflexes; normal gait Psychological:  alert and cooperative; normal mood and affect    Labs Reviewed - No data to display  No results found.  Allergies  Allergen Reactions  . Pork-Derived Products Anaphylaxis, Hives and Swelling    PMHx, SurgHx, SocialHx, Medications, and Allergies were reviewed in the Visit Navigator and  updated as appropriate.      Deatra CanterOxford, Asaiah Scarber J, OregonFNP 10/20/16 [phone]

## 2016-10-20 NOTE — ED Triage Notes (Signed)
[facility] a complaint of dental pain x 2 days.

## 2019-02-27 ENCOUNTER — Other Ambulatory Visit: Payer: Self-pay

## 2019-02-27 DIAGNOSIS — Z20822 Contact with and (suspected) exposure to covid-19: Secondary | ICD-10-CM

## 2019-03-01 LAB — NOVEL CORONAVIRUS, NAA: SARS-CoV-2, NAA: NOT DETECTED

## 2020-07-28 ENCOUNTER — Ambulatory Visit (HOSPITAL_COMMUNITY)
Admission: RE | Admit: 2020-07-28 | Discharge: 2020-07-28 | Disposition: A | Discharge status: Home / Self Care | Payer: BLUE CROSS/BLUE SHIELD | Attending: Psychiatry | Admitting: Psychiatry

## 2020-07-28 DIAGNOSIS — F32A Depression, unspecified: Secondary | ICD-10-CM | POA: Insufficient documentation

## 2020-07-28 DIAGNOSIS — F419 Anxiety disorder, unspecified: Secondary | ICD-10-CM | POA: Diagnosis not present

## 2020-07-28 NOTE — H&P (Addendum)
[facility] Medical Screening Exam  [name] is a 35 y.o. female.patient presented to [facility] as a walk in alone with complaints of "I need help for my depression and anxiety".  [name], 35 y.o., female patient seen face to face by this provider, consulted with Dr. Lucianne Muss; and chart reviewed on 07/29/20.    During evaluation [name] is sitting in no acute distress. She is alert, oriented x 4, calm, cooperative and attentive. Her mood is depressed with congruent affect. She has normal speech, and behavior.  Objectively there is no evidence of psychosis/mania or delusional thinking.  Patient is able to converse coherently, goal directed thoughts, no distractibility, or pre-occupation.  She also denies suicidal/self-harm/homicidal ideation, psychosis, and paranoia.  Patient answered question appropriately.    Patient reports she is going through a stressful time.  States her ex-husband is taking her to court for custody of their children. Children currently  live with her ex-husband. States she has a court date approaching.  Reports  her ex-husband falsely accused her of giving illegal substances to a minor.  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  States that she is seeking help for anxiety and depression.  Patient is requesting an inpatient psychiatric hospital stay. Explained criteria for in patient and discussed having outpatient services in place.  Patient reports she thinks an inpatient hospital stay will help her with her  legal situation. Patient states she had gone to [facility] over the weekend and requested inpatient services, but was told it was only for detox. Patient states she does not need detox, only help for depression and anxiety.  Patient states she does not currently have any outpatient services in place.  States she has no access to firearms/weopons. Patient states she has no  immediate safety concerns and feels safe returning home.   Total Time spent with patient: 30 minutes  Psychiatric Specialty Exam:  Presentation  General Appearance: Appropriate for Environment; Fairly Groomed  Eye Contact:Good  Speech:Clear and Coherent; Normal Rate  Speech Volume:Normal  Handedness:Right   Mood and Affect  Mood:Anxious; Depressed  Affect:Congruent; Depressed   Thought Process  Thought Processes:Coherent  Descriptions of Associations:Intact  Orientation:Full (Time, Place and Person)  Thought Content:Logical  History of Schizophrenia/Schizoaffective disorder:No  Duration of Psychotic Symptoms:No data recorded Hallucinations:Hallucinations: None  Ideas of Reference:None  Suicidal Thoughts:Suicidal Thoughts: No  Homicidal Thoughts:Homicidal Thoughts: No   Sensorium  Memory:Immediate Good; Recent Good; Remote Good  Judgment:Good  Insight:Good   Executive Functions  Concentration:Good  Attention Span:Good  Recall:Good  Fund of Knowledge:Good  Language:Good   Psychomotor Activity  Psychomotor Activity:Psychomotor Activity: Normal   Assets  Assets:Communication Skills; Desire for Improvement; Financial Resources/Insurance; Physical Health; Leisure Time; Resilience; Social Support   Sleep  Sleep:Sleep: Fair    Physical Exam: Physical Exam Constitutional:      Appearance: Normal appearance.  HENT:     Head: Normocephalic.     Right Ear: Tympanic membrane normal.     Nose: Nose normal.     Mouth/Throat:     Mouth: Mucous membranes are dry.     Pharynx: Oropharynx is clear.  Eyes:     Conjunctiva/sclera: Conjunctivae normal.  Cardiovascular:     Rate and Rhythm: Normal rate.     Pulses: Normal pulses.  Pulmonary:     Effort: Pulmonary effort is normal.  Abdominal:     Tenderness: There  is no guarding.  Musculoskeletal:        General: Normal range of motion.  Skin:    General: Skin is warm and dry.      Capillary Refill: Capillary refill takes less than 2 seconds.  Neurological:     Mental Status: She is alert and oriented to person, place, and time.  Psychiatric:        Attention and Perception: Attention normal.        Mood and Affect: Mood is anxious and depressed. Affect is tearful.        Speech: Speech normal.        Behavior: Behavior normal.        Thought Content: Thought content normal.        Cognition and Memory: Cognition normal.        Judgment: Judgment is impulsive.    Review of Systems  Constitutional: Negative.   HENT: Negative.   Eyes: Negative.   Respiratory: Negative.   Cardiovascular: Negative.   Gastrointestinal: Negative.   Genitourinary: Negative.   Musculoskeletal: Negative.   Skin: Negative.   Neurological: Negative.   Endo/Heme/Allergies: Negative.   Psychiatric/Behavioral: Positive for depression. The patient is nervous/anxious.    Blood pressure 102/85, pulse 85, temperature 99 F (37.2 C), temperature source Oral, resp. rate 20, SpO2 99 %. There is no height or weight on file to calculate BMI.  Musculoskeletal: Strength & Muscle Tone: within normal limits Gait & Station: normal Patient leans: N/A   Recommendations:  Based on my evaluation the patient does not appear to have an emergency medical condition.   Patient does not meet criteria for inpatient admission.  Resources were provided for service/supports/treatments.  Medication management, and individual therapy.  Ardis Hughs, NP 07/29/2020, 1:53 PM

## 2020-07-28 NOTE — BH Assessment (Signed)
Comprehensive Clinical Assessment (CCA) Note  07/28/2020 [name] [id number]  Disposition: Per Vernard Gambles, NP, Patient does not meet inpatient admission criteria and can follow-up with an outpatient provider.  Information on [facility] was provided for patient.  The patient demonstrates the following risk factors for suicide: Chronic risk factors for suicide include: Patient has a history of anxiety and depression, she has a history of substance abuse.  Acute risk factors for suicide include: Patient has legal issues, she is abusing cocaine, she has lost custody of her children, she is unable to work and her relationships are unhealthy. Protective factors for this patient include: Patient is not suicidal and does not want to die, patient has family support, patient loves her children and wants to be in their life. Considering these factors, the overall suicide risk at this point appears to be low. Patient is appropriate for outpatient follow up.  PHQ2-9   Flowsheet Row [facility]  PHQ-2 Total Score 3  PHQ-9 Total Score 12       [place] Suicide Severity Rating Scale    1. Wish to be Dead No No  2. Suicidal Thoughts No No  6. Suicide Behavior Question No No  C-SSRS RISK CATEGORY No Risk No Risk    Patient presented to [facility] as a walk-in seeking help for her depression and anxiety.  Patient states that she is under a lot of stress and states that she is not eating or sleeping well.  She states that she has become almost agoraphobic and barely leaves her house.  Patient states that she was married for four to five years  and had two children, but states that she left her husband approximately twelve years ago.  She states that he has been making her life a living [place].  She states that he called the police on her and accused her of contributing to the delinquency to a minor by buying vape pens for minors.  She states that the  police raided her house and when they did they found cocaine and a weapon.  Patient states that she is also in violation of her probation. Patient states that her attorney thought it would be a good idea for her to be checked into a facility.  Patient states that she needs a couple days inpatient in order to get her head straight.  Patient denies SI/HI/Psychosis.  However, she identifies a history of marijuna and heroin use, but states that she had been off opioids for the past two years and states that she has not been using marijuana because she is on probation. Patient states that she has been using cocaine on occasion. Patient states that her children are with their father and she has not seen her children in six weeks due to her husband getting her into trouble,  Patient states that she went to [facility] yesterday to try to get admitted, but she was only offered detox.  Patient takes Adderall for her ADHD and Valium for her anxiety.  She states that she does not abuse this medication and states that she does not need detox services.  Patient is alert and oriented, her mood somewhat depressed, but she is is severely anxious.  Her judgment, insight and impulse control are impaired by her substance use.  Patient's thoughts are organized and her memory is intact.  Patient does not appear to be responding to any internal stimuli.    Chief Complaint:  Chief Complaint  Patient presents with  . Psychiatric Evaluation   Visit Diagnosis: F41.1 Generalized Anxiety Disorder    CCA Screening, Triage and Referral (STR)  Patient Reported Information How did you hear about [place]? Other (Comment)  Referral name: Patient was referred by [facility]  Referral phone number: No data recorded  Whom do you see for routine medical problems? Primary Care  Practice/Facility Name: Rometta Emery, MD  Practice/Facility Phone Number: No data recorded Name of Contact: No data recorded Contact Number: No  data recorded Contact Fax Number: No data recorded Prescriber Name: No data recorded Prescriber Address (if known): No data recorded  What Is the Reason for Your Visit/Call Today? Patient states that she is struggling with anxiety and depression  How Long Has This Been Causing You Problems? > than 6 months  What Do You Feel Would Help You the Most Today? Treatment for Depression or other mood problem   Have You Recently Been in Any Inpatient Treatment (Hospital/Detox/Crisis Center/28-Day Program)? No  Name/Location of Program/Hospital:No data recorded How Long Were You There? No data recorded When Were You Discharged? No data recorded  Have You Ever Received Services From [facility] Before? No  Who Do You See at [facility]? No data recorded  Have You Recently Had Any Thoughts About Hurting Yourself? No  Are You Planning to Commit Suicide/Harm Yourself At This time? No   Have you Recently Had Thoughts About Hurting Someone Karolee Ohs? No  Explanation: No data recorded  Have You Used Any Alcohol or Drugs in the Past 24 Hours? No  How Long Ago Did You Use Drugs or Alcohol? No data recorded What Did You Use and How Much? No data recorded  Do You Currently Have a Therapist/Psychiatrist? No  Name of Therapist/Psychiatrist: No data recorded  Have You Been Recently Discharged From Any Office Practice or Programs? No  Explanation of Discharge From Practice/Program: No data recorded    CCA Screening Triage Referral Assessment Type of Contact: Face-to-Face  Is this Initial or Reassessment? No data recorded Date Telepsych consult ordered in [facility]:  No data recorded Time Telepsych consult ordered in [facility]:  No data recorded  Patient Reported Information Reviewed? Yes  Patient Left Without Being Seen? No data recorded Reason for Not Completing Assessment: No data recorded  Collateral Involvement: no collateral information available   Does Patient Have a Court Appointed Legal  Guardian? No data recorded Name and Contact of Legal Guardian: No data recorded If Minor and Not Living with Parent(s), Who has Custody? No data recorded Is CPS involved or ever been involved? Currently  Is APS involved or ever been involved? Never   Patient Determined To Be At Risk for Harm To Self or Others Based on Review of Patient Reported Information or Presenting Complaint? No  Method: No data recorded Availability of Means: No data recorded Intent: No data recorded Notification Required: No data recorded Additional Information for Danger to Others Potential: No data recorded Additional Comments for Danger to Others Potential: No data recorded Are There Guns or Other Weapons in Your Home? No data recorded Types of Guns/Weapons: No data recorded Are These Weapons Safely Secured?                            No data recorded Who Could Verify You Are Able To Have These Secured: No data recorded Do You Have any Outstanding Charges, Pending Court Dates, Parole/Probation? No data recorded Contacted To  Inform of Risk of Harm To Self or Others: Other: Comment (none reported)   Location of Assessment: [facility]   Does Patient Present under Involuntary Commitment? No  IVC Papers Initial File Date: No data recorded  [place] of Residence: [place]   Patient Currently Receiving the Following Services: Not Receiving Services   Determination of Need: Routine (7 days)   Options For Referral: Medication Management; Outpatient Therapy     CCA Biopsychosocial Intake/Chief Complaint:  Patient presented to [facility] as a walk-in seeking help for her depression and anxiety.  Patient states that she is under a lot of stress and states that she is not eating or sleeping well.  She states that she has become almost agoraphobic and barely leaves her house.  Patient states that she was married for four to five years  and had two children, but states that she left her husband  approximately twelve years ago.  She states that he has been making her life a living [place].  She states that he called the police on her and accused her of contributing to the delinquency to a minor by buying vape pens for minors.  She states that the police raided her house and when they did they found cocaine and a weapon.  Patient states that she is also in violation of her probation. Patient states that her attorney thought it would be a good idea for her to be checked into a facility.  Patient states that she needs a couple days inpatient in order to get her head straight.  Patient denies SI/HI/Psychosis.  However, she identifies a history of marijuna and heroin use, but states that she had been off opioids for the past two years and states that she has not been using marijuana because she is on probation. Patient states that she has been using cocaine on occasion. Patient states that her children are with their father and she has not seen her children in six weeks due to her husband getting her into trouble,  Patient states that she went to [facility] yesterday to try to get admitted, but she was only offered detox.  Patient takes Adderall for her ADHD and Valium for her anxiety.  She states that she does not abuse this medication and states that she does not need detox services.  Current Symptoms/Problems: patient is depressed, tearful and has anxiety   Patient Reported Schizophrenia/Schizoaffective Diagnosis in Past: No   Strengths: not assessed  Preferences: patient has no special needs that require accommodation  Abilities: not assessed   Type of Services Patient Feels are Needed: inpatient   Initial Clinical Notes/Concerns: No data recorded  Mental Health Symptoms Depression:  Increase/decrease in appetite; Sleep (too much or little); Tearfulness   Duration of Depressive symptoms: No data recorded  Mania:  None   Anxiety:   None   Psychosis:  None   Duration of  Psychotic symptoms: No data recorded  Trauma:  None   Obsessions:  None   Compulsions:  None   Inattention:  None   Hyperactivity/Impulsivity:  N/A   Oppositional/Defiant Behaviors:  None   Emotional Irregularity:  Potentially harmful impulsivity; Intense/unstable relationships   Other Mood/Personality Symptoms:  No data recorded   Mental Status Exam Appearance and self-care  Stature:  Average   Weight:  Thin   Clothing:  Neat/clean; Casual   Grooming:  Normal   Cosmetic use:  Inappropriate for age   Posture/gait:  Normal   Motor activity:  Not  Remarkable   Sensorium  Attention:  Normal   Concentration:  Normal   Orientation:  Person; Object; Place; Situation; Time   Recall/memory:  Normal   Affect and Mood  Affect:  Depressed; Flat; Anxious   Mood:  Anxious; Depressed   Relating  Eye contact:  Normal   Facial expression:  Responsive   Attitude toward examiner:  Cooperative   Thought and Language  Speech flow: Clear and Coherent   Thought content:  Appropriate to Mood and Circumstances   Preoccupation:  None   Hallucinations:  None   Organization:  No data recorded  [employer] of Knowledge:  Average   Intelligence:  Average   Abstraction:  Normal   Judgement:  Impaired   Reality Testing:  Realistic   Insight:  Lacking   Decision Making:  Impulsive   Social Functioning  Social Maturity:  Responsible   Social Judgement:  Normal   Stress  Stressors:  Family conflict; Legal; Relationship   Coping Ability:  Exhausted; Overwhelmed   Skill Deficits:  Self-care   Supports:  Family     Religion: Religion/Spirituality Are You A Religious Person?:  (not assessed) How Might This Affect Treatment?: not assessed  Leisure/Recreation: Leisure / Recreation Do You Have Hobbies?: No  Exercise/Diet: Exercise/Diet Do You Exercise?: No Have You Gained or Lost A Significant Amount of Weight in the Past Six Months?:  Yes-Gained Number of Pounds Gained: 12 Do You Follow a Special Diet?: No Do You Have Any Trouble Sleeping?: Yes   CCA Employment/Education Employment/Work Situation: Employment / Work Situation Employment situation: Unemployed Patient's job has been impacted by current illness: Yes Describe how patient's job has been impacted: patient has not worked in a year What is the longest time patient has a held a job?: not assessed Where was the patient employed at that time?: not assessed  Education: Education Is Patient Currently Attending School?: No Last Grade Completed: 12 Name of High School: [employer] Did You Graduate From [employer]?: Yes Did Theme park manager?: Yes What Type of College Degree Do you Have?: attended, but did not finish Did You Attend Graduate School?: No What Was Your Major?: Patient did complete [employer] Did You Have An Individualized Education Program (IIEP): No Did You Have Any Difficulty At School?: No Patient's Education Has Been Impacted by Current Illness: No   CCA Family/Childhood History Family and Relationship History: Family history Marital status: Divorced Divorced, when?: 12 years ago What types of issues is patient dealing with in the relationship?: no issues in current relationship, only with her ex-husband Are you sexually active?: Yes What is your sexual orientation?: heterosexual Has your sexual activity been affected by drugs, alcohol, medication, or emotional stress?: not assessed  Childhood History:  Childhood History By whom was/is the patient raised?: Both parents Description of patient's relationship with caregiver when they were a child: patient states that she had a close relationship with her parents growing up Patient's description of current relationship with people who raised him/her: Patient states that she and her mother argue and her father died in 17-Jul-2012 How were you disciplined when you got in trouble as  a child/adolescent?: not assessed Does patient have siblings?: Yes Number of Siblings: 3 Description of patient's current relationship with siblings: patient states that she is close to her siblings Did patient suffer any verbal/emotional/physical/sexual abuse as a child?: No Has patient ever been sexually abused/assaulted/raped as an adolescent or adult?: No Was the patient ever a victim  of a crime or a disaster?: No Witnessed domestic violence?: No Has patient been affected by domestic violence as an adult?: No  Child/Adolescent Assessment:     CCA Substance Use Alcohol/Drug Use: Alcohol / Drug Use Pain Medications: see MAR Prescriptions: see MAR Over the Counter: see MAR History of alcohol / drug use?: Yes Longest period of sobriety (when/how long): Patient states that she has not used opioid drugs in two years and states that she has not used marijuana since shye has been on probation Negative Consequences of Use: Legal,Personal relationships Withdrawal Symptoms:  (none currently) Substance #1 Name of Substance 1: cocaine 1 - Age of First Use: 35 1 - Amount (size/oz): varies 1 - Frequency: occasionally 1 - Duration: sthis year 1 - Last Use / Amount: UTA 1 - Method of Aquiring: friends 1- Route of Use: snort                       ASAM's:  Six Dimensions of Multidimensional Assessment  Dimension 1:  Acute Intoxication and/or Withdrawal Potential:   Dimension 1:  Description of individual's past and current experiences of substance use and withdrawal: Patient has no current withdrawal symptoms ofr complications  Dimension 2:  Biomedical Conditions and Complications:   Dimension 2:  Description of patient's biomedical conditions and  complications: Patient has no medical issues complicated by her use of drugs  Dimension 3:  Emotional, Behavioral, or Cognitive Conditions and Complications:  Dimension 3:  Description of emotional, behavioral, or cognitive conditions  and complications: Patient uses drugs and alcohol to self-medicate her anxiety and depression  Dimension 4:  Readiness to Change:  Dimension 4:  Description of Readiness to Change criteria: Patient is legally motivated for change, but without her legal issues, it is not likely she would be motivated for change  Dimension 5:  Relapse, Continued use, or Continued Problem Potential:  Dimension 5:  Relapse, continued use, or continued problem potential critiera description: Patient has poor coping skills in order to prevent relapse  Dimension 6:  Recovery/Living Environment:  Dimension 6:  Recovery/Iiving environment criteria description: Patient states that she lives in a safe and supportive environment  ASAM Severity Score: ASAM's Severity Rating Score: 10  ASAM Recommended Level of Treatment: ASAM Recommended Level of Treatment: Level II Intensive Outpatient Treatment   Substance use Disorder (SUD) Substance Use Disorder (SUD)  Checklist Symptoms of Substance Use: Continued use despite having a persistent/recurrent physical/psychological problem caused/exacerbated by use,Continued use despite persistent or recurrent social, interpersonal problems, caused or exacerbated by use,Persistent desire or unsuccessful efforts to cut down or control use,Recurrent use that results in a failure to fulfill major role obligations (work, school, home),Social, occupational, recreational activities given up or reduced due to use,Substance(s) often taken in larger amounts or over longer times than was intended  Recommendations for Services/Supports/Treatments: Recommendations for Services/Supports/Treatments Recommendations For Services/Supports/Treatments: Medication Management,Individual Therapy  DSM5 Diagnoses: Patient Active Problem List   Diagnosis Date Noted  . Bacterial vaginosis 02/22/2011  . IUD (intrauterine device) in place 02/22/2011  . Amenorrhea, primary 09/16/2010  . Generalized anxiety disorder  04/15/2007  . IRRITABLE BOWEL SYNDROME 04/15/2007  . TOBACCO ABUSE 01/29/2007  . ASTHMA 01/29/2007  . OVARIAN CYST 01/29/2007      Referrals to Alternative Service(s): Referred to Alternative Service(s):   Place:   Date:   Time:    Referred to Alternative Service(s):   Place:   Date:   Time:    Referred to Alternative Service(s):  Place:   Date:   Time:    Referred to Alternative Service(s):   Place:   Date:   Time:     Rether Rison J Erminio Nygard, LCAS

## 2022-08-18 DEATH — deceased
